# Patient Record
Sex: Male | Born: 1955 | Race: White | Hispanic: No | Marital: Single | Smoking: Former smoker
Health system: Southern US, Community
[De-identification: ages and names within clinical notes are randomized; demographics above are authoritative.]

## PROBLEM LIST (undated history)

## (undated) DIAGNOSIS — I519 Heart disease, unspecified: Secondary | ICD-10-CM

## (undated) DIAGNOSIS — I219 Acute myocardial infarction, unspecified: Secondary | ICD-10-CM

## (undated) HISTORY — DX: Heart disease, unspecified: I51.9

## (undated) HISTORY — DX: Acute myocardial infarction, unspecified: I21.9

---

## 2010-02-04 ENCOUNTER — Ambulatory Visit: Payer: Self-pay | Admitting: Emergency Medicine

## 2010-02-04 LAB — CONVERTED CEMR LAB
Bilirubin Urine: NEGATIVE
Glucose, Urine, Semiquant: NEGATIVE
Ketones, urine, test strip: NEGATIVE
Nitrite: NEGATIVE
WBC Urine, dipstick: NEGATIVE
pH: 5.5

## 2010-07-15 NOTE — Assessment & Plan Note (Signed)
Summary: Sean Benson PROBLEMS/TJ   Vital Signs:  Patient Profile:   55 Years Old Male CC:      fever, HA, dark urine, vomiting x yesterday Height:     68 inches Weight:      162 pounds O2 Sat:      99 % O2 treatment:    Room Air Temp:     98.7 degrees F oral Pulse rate:   92 / minute Resp:     16 per minute BP sitting:   112 / 72  (left arm) Cuff size:   regular  Pt. in pain?   yes    Location:   head    Intensity:   7    Type:       aching  Vitals Entered By: Lajean Saver RN (February 04, 2010 3:43 PM)                   Updated Prior Medication List: COREG 12.5 MG TABS (CARVEDILOL)  FOLIC ACID 1 MG TABS (FOLIC ACID)  LASIX 80 MG TABS (FUROSEMIDE) BID  Current Allergies: ! PCN ! SULFAHistory of Present Illness History from: patient Chief Complaint: fever, HA, dark urine, vomiting x yesterday History of Present Illness: Patient w/ multiple medical problems including a recent pacemaker implantation for CHF, EtOH abuse, quad bypass.  Presents today with sinus congestion, HA, F/C, coughing.  Baseline SOB.  Also with Nausea yesterday and "dark urine" that has improved today after drinking a few beers.  He states that he was at jury duty yesterday and felt that his anxiety led to these symptoms, and now that he was dismissed due to his heart condition, he is feeling much better.  He is asking for Clindamycin which helps his sinus infection. He thinks his sinus problems are from working in the yard last week.  REVIEW OF SYSTEMS Constitutional Symptoms       Complains of fever and fatigue.     Denies chills, night sweats, weight loss, and weight gain.      Comments: yesterday Eyes       Denies change in vision, eye pain, eye discharge, glasses, contact lenses, and eye surgery. Ear/Nose/Throat/Mouth       Complains of sinus problems.      Denies hearing loss/aids, change in hearing, ear pain, ear discharge, dizziness, frequent runny nose, frequent nose bleeds, sore  throat, hoarseness, and tooth pain or bleeding.  Respiratory       Denies dry cough, productive cough, wheezing, shortness of breath, asthma, bronchitis, and emphysema/COPD.  Cardiovascular       Denies murmurs, chest pain, and tires easily with exhertion.    Gastrointestinal       Complains of nausea/vomiting.      Denies stomach pain, diarrhea, constipation, blood in bowel movements, and indigestion. Genitourniary       Denies painful urination, kidney stones, and loss of urinary control.      Comments: dark urinex this AM Neurological       Denies paralysis, seizures, and fainting/blackouts. Musculoskeletal       Denies muscle pain, joint pain, joint stiffness, decreased range of motion, redness, swelling, muscle weakness, and gout.  Skin       Denies bruising, unusual mles/lumps or sores, and hair/skin or nail changes.  Psych       Denies mood changes, temper/anger issues, anxiety/stress, speech problems, depression, and sleep problems.  Past History:  Past Medical History: Congestive heart failure Quad bypass Defibrilator 01-2010  Past Surgical History: Bypass 2003 Permanent pacemaker 01-2010  Family History: Mother- cardiac stents Brother Bypass Family History Diabetes 1st degree relative- father  Social History: Current Smoker 1-2 ppd Alcohol use-yes 4-6/day Drug use-no Smoking Status:  current Drug Use:  no Physical Exam General appearance: well developed, well nourished, no acute distress Ears: normal, no lesions or deformities.  +cerumen Nasal: mucosa pink, nonedematous, no septal deviation, turbinates normal Oral/Pharynx: pharyngeal erythema without exudate, uvula midline without deviation Neck: ant cerv tender LAD Chest/Lungs: no rales, wheezes, or rhonchi bilateral, breath sounds equal without effort.  ICD pacemaker is palpable under left chest skin and scar in well-healed Heart: regular rate and  rhythm, no murmur Abdomen: soft, non-tender without obvious  organomegaly Skin: no obvious rashes or lesions MSE: oriented to time, place, and person Assessment New Problems: UPPER RESPIRATORY INFECTION, ACUTE (ICD-465.9) FAMILY HISTORY DIABETES 1ST DEGREE RELATIVE (ICD-V18.0) CONGESTIVE HEART FAILURE (ICD-428.0)  Likely viral URI + possible viral gastroenteritis yesterday (resolved),.  But with his history of recent heart surgery, he should probably be treated just in case it is bacterial sinusitis  Plan New Medications/Changes: CLINDAMYCIN HCL 300 MG CAPS (CLINDAMYCIN HCL) 1 tab by mouth two times a day for 7 days  #14 x 0, 02/04/2010, Hoyt Koch MD  New Orders: New Patient Level III [id number] Planning Comments:   Establish a PCP, especially with all your medical problems because your cardiologist likely can't do everything for you Cut down on drinking and monitor your fluid intake because of your CHF Take the meds as your doctor has instructed Stop smoking as this causes more heart problems Rest Wear a mask when working in your yard   The patient and/or caregiver has been counseled thoroughly with regard to medications prescribed including dosage, schedule, interactions, rationale for use, and possible side effects and they verbalize understanding.  Diagnoses and expected course of recovery discussed and will return if not improved as expected or if the condition worsens. Patient and/or caregiver verbalized understanding.  Prescriptions: CLINDAMYCIN HCL 300 MG CAPS (CLINDAMYCIN HCL) 1 tab by mouth two times a day for 7 days  #14 x 0   Entered and Authorized by:   Hoyt Koch MD   Signed by:   Hoyt Koch MD on 02/04/2010   Method used:   Print then Give to Patient   RxID:   [id number]   Orders Added: 1)  New Patient Level III [60737]  Laboratory Results   Urine Tests  Date/Time Received: February 04, 2010 4:16 PM  Date/Time Reported: February 04, 2010 4:16 PM   Routine Urinalysis   Color: yellow Appearance:  Clear Glucose: negative   (Normal Range: Negative) Bilirubin: negative   (Normal Range: Negative) Ketone: negative   (Normal Range: Negative) Spec. Gravity: <1.005   (Normal Range: 1.003-1.035) Blood: trace-lysed   (Normal Range: Negative) pH: 5.5   (Normal Range: 5.0-8.0) Protein: negative   (Normal Range: Negative) Urobilinogen: 0.2   (Normal Range: 0-1) Nitrite: negative   (Normal Range: Negative) Leukocyte Esterace: negative   (Normal Range: Negative)

## 2011-08-20 DIAGNOSIS — I428 Other cardiomyopathies: Secondary | ICD-10-CM | POA: Diagnosis not present

## 2011-08-20 DIAGNOSIS — I509 Heart failure, unspecified: Secondary | ICD-10-CM | POA: Diagnosis not present

## 2011-08-20 DIAGNOSIS — I2589 Other forms of chronic ischemic heart disease: Secondary | ICD-10-CM | POA: Diagnosis not present

## 2011-08-20 DIAGNOSIS — I10 Essential (primary) hypertension: Secondary | ICD-10-CM | POA: Diagnosis not present

## 2011-09-17 DIAGNOSIS — I509 Heart failure, unspecified: Secondary | ICD-10-CM | POA: Diagnosis not present

## 2011-09-18 DIAGNOSIS — I259 Chronic ischemic heart disease, unspecified: Secondary | ICD-10-CM | POA: Insufficient documentation

## 2011-09-18 DIAGNOSIS — Z951 Presence of aortocoronary bypass graft: Secondary | ICD-10-CM | POA: Insufficient documentation

## 2011-10-01 DIAGNOSIS — I739 Peripheral vascular disease, unspecified: Secondary | ICD-10-CM | POA: Diagnosis not present

## 2011-12-31 DIAGNOSIS — I2589 Other forms of chronic ischemic heart disease: Secondary | ICD-10-CM | POA: Diagnosis not present

## 2012-01-04 DIAGNOSIS — J811 Chronic pulmonary edema: Secondary | ICD-10-CM | POA: Diagnosis not present

## 2012-01-04 DIAGNOSIS — I2589 Other forms of chronic ischemic heart disease: Secondary | ICD-10-CM | POA: Diagnosis not present

## 2012-01-04 DIAGNOSIS — I509 Heart failure, unspecified: Secondary | ICD-10-CM | POA: Diagnosis not present

## 2012-01-04 DIAGNOSIS — I739 Peripheral vascular disease, unspecified: Secondary | ICD-10-CM | POA: Diagnosis not present

## 2012-01-04 DIAGNOSIS — I4949 Other premature depolarization: Secondary | ICD-10-CM | POA: Diagnosis not present

## 2012-01-05 DIAGNOSIS — I5023 Acute on chronic systolic (congestive) heart failure: Secondary | ICD-10-CM | POA: Diagnosis not present

## 2012-01-06 DIAGNOSIS — I5023 Acute on chronic systolic (congestive) heart failure: Secondary | ICD-10-CM | POA: Diagnosis not present

## 2012-01-22 DIAGNOSIS — I509 Heart failure, unspecified: Secondary | ICD-10-CM | POA: Diagnosis not present

## 2012-01-22 DIAGNOSIS — I739 Peripheral vascular disease, unspecified: Secondary | ICD-10-CM | POA: Diagnosis not present

## 2012-02-11 DIAGNOSIS — I509 Heart failure, unspecified: Secondary | ICD-10-CM | POA: Diagnosis not present

## 2012-03-06 DIAGNOSIS — Z8679 Personal history of other diseases of the circulatory system: Secondary | ICD-10-CM | POA: Diagnosis not present

## 2012-03-06 DIAGNOSIS — Z7982 Long term (current) use of aspirin: Secondary | ICD-10-CM | POA: Diagnosis not present

## 2012-03-06 DIAGNOSIS — T792XXA Traumatic secondary and recurrent hemorrhage and seroma, initial encounter: Secondary | ICD-10-CM | POA: Diagnosis not present

## 2012-03-06 DIAGNOSIS — R58 Hemorrhage, not elsewhere classified: Secondary | ICD-10-CM | POA: Diagnosis not present

## 2012-05-11 DIAGNOSIS — I2589 Other forms of chronic ischemic heart disease: Secondary | ICD-10-CM | POA: Diagnosis not present

## 2012-07-21 DIAGNOSIS — Z23 Encounter for immunization: Secondary | ICD-10-CM | POA: Diagnosis not present

## 2012-08-15 DIAGNOSIS — I2589 Other forms of chronic ischemic heart disease: Secondary | ICD-10-CM | POA: Diagnosis not present

## 2012-10-06 DIAGNOSIS — I2589 Other forms of chronic ischemic heart disease: Secondary | ICD-10-CM | POA: Diagnosis not present

## 2012-10-09 DIAGNOSIS — K709 Alcoholic liver disease, unspecified: Secondary | ICD-10-CM | POA: Insufficient documentation

## 2012-10-09 DIAGNOSIS — I455 Other specified heart block: Secondary | ICD-10-CM | POA: Diagnosis not present

## 2012-10-09 DIAGNOSIS — T82198A Other mechanical complication of other cardiac electronic device, initial encounter: Secondary | ICD-10-CM | POA: Insufficient documentation

## 2012-10-09 DIAGNOSIS — I4891 Unspecified atrial fibrillation: Secondary | ICD-10-CM | POA: Insufficient documentation

## 2012-10-09 DIAGNOSIS — I499 Cardiac arrhythmia, unspecified: Secondary | ICD-10-CM | POA: Diagnosis not present

## 2012-10-09 DIAGNOSIS — Z95810 Presence of automatic (implantable) cardiac defibrillator: Secondary | ICD-10-CM | POA: Diagnosis not present

## 2012-10-10 DIAGNOSIS — I442 Atrioventricular block, complete: Secondary | ICD-10-CM | POA: Diagnosis not present

## 2012-10-10 DIAGNOSIS — I369 Nonrheumatic tricuspid valve disorder, unspecified: Secondary | ICD-10-CM | POA: Diagnosis not present

## 2012-10-11 DIAGNOSIS — I509 Heart failure, unspecified: Secondary | ICD-10-CM | POA: Diagnosis not present

## 2012-10-14 DIAGNOSIS — Z9889 Other specified postprocedural states: Secondary | ICD-10-CM | POA: Diagnosis not present

## 2012-10-14 DIAGNOSIS — I4891 Unspecified atrial fibrillation: Secondary | ICD-10-CM | POA: Diagnosis not present

## 2012-10-26 DIAGNOSIS — I4891 Unspecified atrial fibrillation: Secondary | ICD-10-CM | POA: Diagnosis not present

## 2012-10-26 DIAGNOSIS — T82198A Other mechanical complication of other cardiac electronic device, initial encounter: Secondary | ICD-10-CM | POA: Diagnosis not present

## 2012-10-26 DIAGNOSIS — Z9889 Other specified postprocedural states: Secondary | ICD-10-CM | POA: Diagnosis not present

## 2012-10-26 DIAGNOSIS — I5022 Chronic systolic (congestive) heart failure: Secondary | ICD-10-CM | POA: Diagnosis not present

## 2012-11-02 DIAGNOSIS — Z9889 Other specified postprocedural states: Secondary | ICD-10-CM | POA: Diagnosis not present

## 2012-11-02 DIAGNOSIS — I4891 Unspecified atrial fibrillation: Secondary | ICD-10-CM | POA: Diagnosis not present

## 2012-11-15 DIAGNOSIS — Z9889 Other specified postprocedural states: Secondary | ICD-10-CM | POA: Diagnosis not present

## 2012-11-15 DIAGNOSIS — I4891 Unspecified atrial fibrillation: Secondary | ICD-10-CM | POA: Diagnosis not present

## 2012-11-18 DIAGNOSIS — I4949 Other premature depolarization: Secondary | ICD-10-CM | POA: Diagnosis not present

## 2012-11-18 DIAGNOSIS — M7989 Other specified soft tissue disorders: Secondary | ICD-10-CM | POA: Diagnosis not present

## 2012-11-18 DIAGNOSIS — R0602 Shortness of breath: Secondary | ICD-10-CM | POA: Diagnosis not present

## 2012-11-18 DIAGNOSIS — I5022 Chronic systolic (congestive) heart failure: Secondary | ICD-10-CM | POA: Diagnosis not present

## 2012-11-28 DIAGNOSIS — I10 Essential (primary) hypertension: Secondary | ICD-10-CM | POA: Diagnosis present

## 2012-11-28 DIAGNOSIS — F411 Generalized anxiety disorder: Secondary | ICD-10-CM | POA: Diagnosis present

## 2012-11-28 DIAGNOSIS — I509 Heart failure, unspecified: Secondary | ICD-10-CM | POA: Diagnosis not present

## 2012-11-28 DIAGNOSIS — Z882 Allergy status to sulfonamides status: Secondary | ICD-10-CM | POA: Diagnosis not present

## 2012-11-28 DIAGNOSIS — Z87891 Personal history of nicotine dependence: Secondary | ICD-10-CM | POA: Diagnosis not present

## 2012-11-28 DIAGNOSIS — K859 Acute pancreatitis without necrosis or infection, unspecified: Secondary | ICD-10-CM | POA: Diagnosis not present

## 2012-11-28 DIAGNOSIS — K8020 Calculus of gallbladder without cholecystitis without obstruction: Secondary | ICD-10-CM | POA: Diagnosis not present

## 2012-11-28 DIAGNOSIS — R0602 Shortness of breath: Secondary | ICD-10-CM | POA: Diagnosis not present

## 2012-11-28 DIAGNOSIS — E785 Hyperlipidemia, unspecified: Secondary | ICD-10-CM | POA: Diagnosis present

## 2012-11-28 DIAGNOSIS — I4891 Unspecified atrial fibrillation: Secondary | ICD-10-CM | POA: Diagnosis present

## 2012-11-28 DIAGNOSIS — I499 Cardiac arrhythmia, unspecified: Secondary | ICD-10-CM | POA: Diagnosis not present

## 2012-11-28 DIAGNOSIS — R188 Other ascites: Secondary | ICD-10-CM | POA: Diagnosis not present

## 2012-11-28 DIAGNOSIS — Z7982 Long term (current) use of aspirin: Secondary | ICD-10-CM | POA: Diagnosis not present

## 2012-11-28 DIAGNOSIS — Z95810 Presence of automatic (implantable) cardiac defibrillator: Secondary | ICD-10-CM | POA: Diagnosis not present

## 2012-11-28 DIAGNOSIS — I5022 Chronic systolic (congestive) heart failure: Secondary | ICD-10-CM | POA: Diagnosis not present

## 2012-11-28 DIAGNOSIS — Z880 Allergy status to penicillin: Secondary | ICD-10-CM | POA: Diagnosis not present

## 2012-11-28 DIAGNOSIS — K8070 Calculus of gallbladder and bile duct without cholecystitis without obstruction: Secondary | ICD-10-CM | POA: Diagnosis not present

## 2012-11-28 DIAGNOSIS — I5023 Acute on chronic systolic (congestive) heart failure: Secondary | ICD-10-CM | POA: Diagnosis not present

## 2012-11-28 DIAGNOSIS — Z7901 Long term (current) use of anticoagulants: Secondary | ICD-10-CM | POA: Diagnosis not present

## 2012-11-28 DIAGNOSIS — Z951 Presence of aortocoronary bypass graft: Secondary | ICD-10-CM | POA: Diagnosis not present

## 2012-11-28 DIAGNOSIS — K219 Gastro-esophageal reflux disease without esophagitis: Secondary | ICD-10-CM | POA: Diagnosis present

## 2012-11-28 DIAGNOSIS — I2510 Atherosclerotic heart disease of native coronary artery without angina pectoris: Secondary | ICD-10-CM | POA: Diagnosis present

## 2012-11-28 DIAGNOSIS — F329 Major depressive disorder, single episode, unspecified: Secondary | ICD-10-CM | POA: Diagnosis present

## 2012-11-28 DIAGNOSIS — R1031 Right lower quadrant pain: Secondary | ICD-10-CM | POA: Diagnosis not present

## 2012-11-29 DIAGNOSIS — K859 Acute pancreatitis without necrosis or infection, unspecified: Secondary | ICD-10-CM | POA: Insufficient documentation

## 2012-12-01 DIAGNOSIS — I5022 Chronic systolic (congestive) heart failure: Secondary | ICD-10-CM | POA: Diagnosis not present

## 2012-12-01 DIAGNOSIS — I509 Heart failure, unspecified: Secondary | ICD-10-CM | POA: Diagnosis not present

## 2012-12-01 DIAGNOSIS — I2510 Atherosclerotic heart disease of native coronary artery without angina pectoris: Secondary | ICD-10-CM | POA: Diagnosis not present

## 2012-12-02 DIAGNOSIS — K859 Acute pancreatitis without necrosis or infection, unspecified: Secondary | ICD-10-CM | POA: Diagnosis not present

## 2012-12-08 DIAGNOSIS — I509 Heart failure, unspecified: Secondary | ICD-10-CM | POA: Diagnosis not present

## 2012-12-25 DIAGNOSIS — K8020 Calculus of gallbladder without cholecystitis without obstruction: Secondary | ICD-10-CM | POA: Diagnosis not present

## 2012-12-25 DIAGNOSIS — I4949 Other premature depolarization: Secondary | ICD-10-CM | POA: Diagnosis not present

## 2012-12-25 DIAGNOSIS — R109 Unspecified abdominal pain: Secondary | ICD-10-CM | POA: Diagnosis not present

## 2012-12-25 DIAGNOSIS — R609 Edema, unspecified: Secondary | ICD-10-CM | POA: Diagnosis not present

## 2012-12-25 DIAGNOSIS — R0602 Shortness of breath: Secondary | ICD-10-CM | POA: Diagnosis not present

## 2012-12-25 DIAGNOSIS — Z951 Presence of aortocoronary bypass graft: Secondary | ICD-10-CM | POA: Diagnosis not present

## 2012-12-25 DIAGNOSIS — M7989 Other specified soft tissue disorders: Secondary | ICD-10-CM | POA: Diagnosis not present

## 2012-12-25 DIAGNOSIS — R630 Anorexia: Secondary | ICD-10-CM | POA: Diagnosis not present

## 2012-12-25 DIAGNOSIS — I509 Heart failure, unspecified: Secondary | ICD-10-CM | POA: Diagnosis not present

## 2012-12-25 DIAGNOSIS — Z8679 Personal history of other diseases of the circulatory system: Secondary | ICD-10-CM | POA: Diagnosis not present

## 2012-12-25 DIAGNOSIS — R110 Nausea: Secondary | ICD-10-CM | POA: Diagnosis not present

## 2012-12-25 DIAGNOSIS — I10 Essential (primary) hypertension: Secondary | ICD-10-CM | POA: Diagnosis not present

## 2012-12-26 DIAGNOSIS — I509 Heart failure, unspecified: Secondary | ICD-10-CM | POA: Diagnosis not present

## 2012-12-26 DIAGNOSIS — R0602 Shortness of breath: Secondary | ICD-10-CM | POA: Diagnosis not present

## 2012-12-28 DIAGNOSIS — E871 Hypo-osmolality and hyponatremia: Secondary | ICD-10-CM | POA: Insufficient documentation

## 2013-01-02 ENCOUNTER — Ambulatory Visit (INDEPENDENT_AMBULATORY_CARE_PROVIDER_SITE_OTHER): Payer: Medicare Other | Admitting: Family Medicine

## 2013-01-02 ENCOUNTER — Encounter: Payer: Self-pay | Admitting: Family Medicine

## 2013-01-02 VITALS — BP 115/66 | HR 70 | Ht 67.5 in | Wt 209.0 lb

## 2013-01-02 DIAGNOSIS — I5022 Chronic systolic (congestive) heart failure: Secondary | ICD-10-CM

## 2013-01-02 DIAGNOSIS — I4891 Unspecified atrial fibrillation: Secondary | ICD-10-CM | POA: Diagnosis not present

## 2013-01-02 DIAGNOSIS — I83893 Varicose veins of bilateral lower extremities with other complications: Secondary | ICD-10-CM

## 2013-01-02 DIAGNOSIS — I509 Heart failure, unspecified: Secondary | ICD-10-CM

## 2013-01-02 DIAGNOSIS — R609 Edema, unspecified: Secondary | ICD-10-CM

## 2013-01-02 DIAGNOSIS — R601 Generalized edema: Secondary | ICD-10-CM

## 2013-01-02 DIAGNOSIS — I83892 Varicose veins of left lower extremities with other complications: Secondary | ICD-10-CM

## 2013-01-02 DIAGNOSIS — I83899 Varicose veins of unspecified lower extremities with other complications: Secondary | ICD-10-CM | POA: Insufficient documentation

## 2013-01-02 DIAGNOSIS — Z95810 Presence of automatic (implantable) cardiac defibrillator: Secondary | ICD-10-CM

## 2013-01-02 MED ORDER — FUROSEMIDE 80 MG PO TABS: ORAL_TABLET | ORAL | Status: DC

## 2013-01-02 MED ORDER — TRAMADOL HCL 50 MG PO TABS: 50.0000 mg | ORAL_TABLET | Freq: Four times a day (QID) | ORAL | Status: DC | PRN

## 2013-01-02 NOTE — Progress Notes (Signed)
CC: Sean Benson is a 57 y.o. male is here for Establish Care, Gout? and check vein on foot   Subjective: HPI:  Patient presents to establish care  Patient complains of right and left leg pain is localized to the thighs and knees. Has been present for 2 months is described as a tightness and soreness. It is worse with touching the knees or with flexion of the knees. There is no pain at rest otherwise it is moderate severity. He was prescribed hydrocodone from another provider last week which helps somewhat nothing else makes better or worse. It has been accompanied with worsening swelling thought to be due to congestive heart failure. He has been taking Lasix 80 mg twice a day up until last week when a nurse practitioner that visits him for heart failure monitoring switched him to demadex 80mg  daily at that time he noticed a significant worsening of his swelling involving the entire legs and abdomen.  Over the weekend he switched back to Lasix 80 mg twice a day his former dosage feels he has made some improvement since the switch but still having pain and swelling in both thighs.  When questioned about diet, salt intake, and medication regimen he persistently changes the subject or does not answer the question. On chart review he has a history of heavy alcohol use and has been unable to follow salt restriction and fluid restriction recommendations from former providers at [facility].  Patient complains of left foot pain and bleeding. The pain is described as mild irritation on the surface of the foot. One week ago he noticed spontaneous bleeding from the top of the left foot, if he does not keep this wound covered it frequently begins bleeding again. He tells me prior to the bleeding he noticed a vein that was bulging out of the skin. He's keeping the wound covered no other interventions. He denies bleeding elsewhere from the body, bruising nor blood in stool, he does not answer whether or not he is  still taking Coumadin or aspirin.  Review of Systems - General ROS: negative for - chills, fever, night sweats, weight gain or weight loss Ophthalmic ROS: negative for - decreased vision Psychological ROS: negative for - anxiety or depression ENT ROS: negative for - hearing change, nasal congestion, tinnitus or allergies Hematological and Lymphatic ROS: negative for - bleeding problems, bruising or swollen lymph nodes Breast ROS: negative Respiratory ROS: noshortness of breath, or wheezing Cardiovascular ROS: no chest pain or dyspnea on exertion Gastrointestinal ROS: no abdominal pain, change in bowel habits, or black or bloody stools Genito-Urinary ROS: negative for - genital discharge, genital ulcers, incontinence or abnormal bleeding from genitals Musculoskeletal ROS: negative for - joint pain or muscle pain other than that above Neurological ROS: negative for - headaches or memory loss Dermatological ROS: negative for lumps, mole changes, rash and skin lesion changes  Past Medical History  Diagnosis Date  . Heart disease   . Heart attack      Family History  Problem Relation Age of Onset  . Alcoholism    . Diabetes Father      History  Substance Use Topics  . Smoking status: Former Games developer  . Smokeless tobacco: Not on file  . Alcohol Use: Yes     Objective: Filed Vitals:   01/02/13 1505  BP: 115/66  Pulse: 70    General: Alert and Oriented, No Acute Distress HEENT: Pupils equal, round, reactive to light. Conjunctivae clear.  Moist mucous membranes  pharynx unremarkable Lungs: Clear to auscultation bilaterally, no wheezing/ronchi/rales.  Comfortable work of breathing. Good air movement. Cardiac: Regular rate and rhythm. Normal S1/S2.  No murmurs, rubs, nor gallops.   Abdomen: Obese soft nontender Extremities: 1+ pitting edema from the shins proximally into the thighs..  Trace dorsalis pedis pulse bilaterally. Both right and left knees have full range of motion and  strength however flexion beyond 15 reproduces presenting pain, anterior drawer negative, no pain or laxity without gross or varus stress, patient unable to tolerate mcMurray's due to pain Mental Status: No depression, anxiety, nor agitation. Skin: Warm and dry. On the left lateral dorsal surface there is a well-healing scabbed over shallow ulceration without active bleeding or evidence of infection approximately 3 mm in diameter  Assessment & Plan: Sean Benson was seen today for establish care, gout? and check vein on foot.  Diagnoses and associated orders for this visit:  CHF (congestive heart failure), chronic, systolic - furosemide (LASIX) 80 MG tablet; One by mouth two to three times a day as needed for swelling.  Edema - traMADol (ULTRAM) 50 MG tablet; Take 1 tablet (50 mg total) by mouth every 6 (six) hours as needed for pain.  Bleeding from varicose vein, left  Anasarca  Atrial fibrillation  Automatic implantable cardiac defibrillator in situ  Other Orders - aspirin EC 81 MG tablet; Take 1 tablet (81 mg total) by mouth daily. - carvedilol (COREG) 12.5 MG tablet; Take 1 tablet (12.5 mg total) by mouth 2 (two) times daily. - colchicine 0.6 MG tablet; Take 1 tablet (0.6 mg total) by mouth daily. - lisinopril (PRINIVIL,ZESTRIL) 5 MG tablet; Take 1 tablet (5 mg total) by mouth daily. - lovastatin (MEVACOR) 20 MG tablet; Take 1 tablet (20 mg total) by mouth daily. - omeprazole (PRILOSEC) 20 MG capsule; Take 1 capsule (20 mg total) by mouth daily. - sertraline (ZOLOFT) 25 MG tablet; Take 1 tablet (25 mg total) by mouth daily. - spironolactone (ALDACTONE) 25 MG tablet; Take 1 tablet (25 mg total) by mouth daily. - warfarin (COUMADIN) 5 MG tablet; Take 1 tablet (5 mg total) by mouth daily.    Peripheral edema: Discussed with patient I believe his pain is due to substantial edema in the pain he is experiencing is coming from stretching the skin with knee movement, I would expect this to  improve drastically if diuretic regimen was optimized. Will restart his former Lasix regimen, I encouraged him to use 3 times a day dosing for the next 2 days then go back to only twice a day. He is requesting hydrocodone for pain but is quite agreeable with using tramadol instead. Patient refuses compression stockings, encouraged him to focus on less than 1.5 L of fluid a day and less than 1500 mg of salt daily. Bleeding varicose vein: Provided patient with reassurance that compression and cleanliness should allow this to heal in the next one to 2 weeks. Covered with sterile gauze and compression with coban given supplies to do this at home daily for the next week. Counseled that ambiguity about medication regimen could complicate receiving optimal medical care.  Return in about 2 weeks (around 01/16/2013).

## 2013-01-06 ENCOUNTER — Telehealth: Payer: Self-pay

## 2013-01-06 DIAGNOSIS — I5022 Chronic systolic (congestive) heart failure: Secondary | ICD-10-CM

## 2013-01-06 MED ORDER — METOLAZONE 2.5 MG PO TABS: ORAL_TABLET | ORAL | Status: DC

## 2013-01-06 NOTE — Telephone Encounter (Signed)
Patient advised.

## 2013-01-06 NOTE — Telephone Encounter (Signed)
Sean Benson,  Will you please let Jevaughn know that he might get some benefit from adding a medication called metolazone to his medication reigmen, it's taken once a day, I've sent this to his [facility].

## 2013-01-06 NOTE — Telephone Encounter (Signed)
He is swelling in legs and shortness of breath for 9 weeks. No improvement since the increase of his lasix. He is taking lasix 80 mg TID.  No chest pain.

## 2013-02-09 ENCOUNTER — Ambulatory Visit (INDEPENDENT_AMBULATORY_CARE_PROVIDER_SITE_OTHER): Payer: Medicare Other | Admitting: Family Medicine

## 2013-02-09 ENCOUNTER — Encounter: Payer: Self-pay | Admitting: Family Medicine

## 2013-02-09 VITALS — BP 123/67 | HR 77 | Wt 192.0 lb

## 2013-02-09 DIAGNOSIS — L299 Pruritus, unspecified: Secondary | ICD-10-CM | POA: Diagnosis not present

## 2013-02-09 DIAGNOSIS — I509 Heart failure, unspecified: Secondary | ICD-10-CM | POA: Diagnosis not present

## 2013-02-09 MED ORDER — METHYLPREDNISOLONE ACETATE 80 MG/ML IJ SUSP
80.0000 mg | Freq: Once | INTRAMUSCULAR | Status: AC
  Administered 2013-02-09: 80 mg via INTRAMUSCULAR

## 2013-02-09 NOTE — Progress Notes (Signed)
CC: Sean Benson is a 57 y.o. male is here for itching all over and Leg Pain   Subjective: HPI:  Patient complains of itching and has been present for the last 2-3 weeks is present on a daily basis described as severe in severity it is localized from the neck to the abdomen 360 around it also includes the lower legs anteriorly and posteriorly.  He denies change of personal care products, it is improved with hydroxyzine however this puts him to sleep for 12-24 hours. He has tried over-the-counter medications antihistamines which also will put him to sleep for over 12 hours. He denies rashes or skin changes. Nothing else seems to make itching better or worse it is interfering with his sleep. Denies shortness of breath, wheezing, abdominal pain, nor throat closure. States a history of this that comes and goes every year or 2.  Followup CHF: Over the past month he's lost 18 pounds, he was taking Zaroxolyn every morning before Lasix but stopped this after week due to significant increase in edema. Currently he is only taking Lasix 80 mg 2-3 times a day. He continues to drink beer every day and will not quantify how much is drinking. Denies shortness of breath, peripheral edema, PND, orthopnea, abdominal bloating.    Review Of Systems Outlined In HPI  Past Medical History  Diagnosis Date  . Heart disease   . Heart attack      Family History  Problem Relation Age of Onset  . Alcoholism    . Diabetes Father      History  Substance Use Topics  . Smoking status: Former Games developer  . Smokeless tobacco: Not on file  . Alcohol Use: Yes     Objective: Filed Vitals:   02/09/13 1037  BP: 123/67  Pulse: 77    General: Alert and Oriented, No Acute Distress HEENT: Pupils equal, round, reactive to light. Conjunctivae clear.  Moist mucous membranes pharynx unremarkable Lungs: Clear to auscultation bilaterally, no wheezing/ronchi/rales.  Comfortable work of breathing. Good air  movement. Cardiac: Regular rate and rhythm. Normal S1/S2.  No murmurs, rubs, nor gallops.   Abdomen: Mildly distended nontender to palpation Extremities: No peripheral edema.  Strong peripheral pulses.  Mental Status: No depression, anxiety, nor agitation. Skin: Warm and dry. On the torso upper extremity lower extremity there are no skin abnormalities nor erythema  Assessment & Plan: Sean Benson was seen today for itching all over and leg pain.  Diagnoses and associated orders for this visit:  CHF (congestive heart failure)  Itching    Itching: Encouraged patient to have metabolic panel concerned that some of his be due to bile acids due to his cirrhosis and this would help somewhat with whether or not bile acids sequester with be of benefit, he declines. We discussed oral options to control the itch along with intramuscular steroids he would prefer to try Depo-Medrol today will return next week if not improving CHF: Improved continue beta blocker, aspirin, Lasix, lisinopril, spironolactone. Strongly encouraged to stop drinking  25 minutes spent face-to-face during visit today of which at least 50% was counseling or coordinating care regarding CHF, itching.   Return in about 4 weeks (around 03/09/2013).

## 2013-02-09 NOTE — Addendum Note (Signed)
Addended by: Wyline Beady on: 02/09/2013 01:15 PM   Modules accepted: Orders

## 2013-02-10 DIAGNOSIS — I2589 Other forms of chronic ischemic heart disease: Secondary | ICD-10-CM | POA: Diagnosis not present

## 2013-02-17 ENCOUNTER — Telehealth: Payer: Self-pay | Admitting: *Deleted

## 2013-02-17 DIAGNOSIS — L299 Pruritus, unspecified: Secondary | ICD-10-CM

## 2013-02-17 MED ORDER — HYDROXYZINE HCL 25 MG PO TABS: ORAL_TABLET | ORAL | Status: DC

## 2013-02-17 NOTE — Telephone Encounter (Signed)
Rx sent to [facility] in [place] 

## 2013-02-17 NOTE — Telephone Encounter (Signed)
Pt.notified

## 2013-02-17 NOTE — Telephone Encounter (Signed)
Pt states he is still itching. Informed pt your notes say f/u in a week if no better. Pt is asking if you can send something to the pharmacy for the itching. Please advise.  Meyer Cory, LPN

## 2013-02-17 NOTE — Telephone Encounter (Signed)
Pt called and left a message with his name and that's it.Sean Benson called back and left a message stating to call back and leave a brief detailed message as what his concerns are so that his needs can be addressed

## 2013-03-01 ENCOUNTER — Telehealth: Payer: Self-pay | Admitting: *Deleted

## 2013-03-01 DIAGNOSIS — L299 Pruritus, unspecified: Secondary | ICD-10-CM | POA: Insufficient documentation

## 2013-03-01 MED ORDER — CHOLESTYRAMINE 4 G PO PACK: 1.0000 | PACK | Freq: Two times a day (BID) | ORAL | Status: DC

## 2013-03-01 NOTE — Telephone Encounter (Signed)
Pt states he is still itching and he can't take Benadryl b/c he had problems with it years ago and states the Vistaril cause him to have severe stomach pain. He is using Hydrocortisone cream on it already.  He is asking what else can he do or what else you can give him.

## 2013-03-01 NOTE — Telephone Encounter (Signed)
I'd like him to try cholestyramine powder taken in water twice a day sent to his [facility], he could even add certrizine 10mg  daily.

## 2013-03-01 NOTE — Telephone Encounter (Signed)
Pt informed.  Misty Ahmad, LPN  

## 2013-04-24 ENCOUNTER — Other Ambulatory Visit: Payer: Self-pay | Admitting: *Deleted

## 2013-04-24 ENCOUNTER — Other Ambulatory Visit: Payer: Self-pay

## 2013-04-24 ENCOUNTER — Telehealth: Payer: Self-pay | Admitting: *Deleted

## 2013-04-24 DIAGNOSIS — R609 Edema, unspecified: Secondary | ICD-10-CM

## 2013-04-24 DIAGNOSIS — I5022 Chronic systolic (congestive) heart failure: Secondary | ICD-10-CM

## 2013-04-24 MED ORDER — TRAMADOL HCL 50 MG PO TABS: 50.0000 mg | ORAL_TABLET | Freq: Four times a day (QID) | ORAL | Status: DC | PRN

## 2013-04-24 MED ORDER — METOLAZONE 2.5 MG PO TABS: ORAL_TABLET | ORAL | Status: DC

## 2013-04-24 NOTE — Telephone Encounter (Signed)
Pt informed he can take Zyrtec 1 tab Q12hrs per Dr. Ivan Anchors.  Meyer Cory, LPN

## 2013-06-03 ENCOUNTER — Other Ambulatory Visit: Payer: Self-pay | Admitting: Family Medicine

## 2013-06-11 DIAGNOSIS — Z4682 Encounter for fitting and adjustment of non-vascular catheter: Secondary | ICD-10-CM | POA: Diagnosis not present

## 2013-06-11 DIAGNOSIS — I4949 Other premature depolarization: Secondary | ICD-10-CM | POA: Diagnosis not present

## 2013-06-11 DIAGNOSIS — I469 Cardiac arrest, cause unspecified: Secondary | ICD-10-CM | POA: Diagnosis not present

## 2013-06-11 DIAGNOSIS — R4182 Altered mental status, unspecified: Secondary | ICD-10-CM | POA: Diagnosis not present

## 2013-06-11 DIAGNOSIS — J9600 Acute respiratory failure, unspecified whether with hypoxia or hypercapnia: Secondary | ICD-10-CM | POA: Diagnosis not present

## 2013-06-11 DIAGNOSIS — J811 Chronic pulmonary edema: Secondary | ICD-10-CM | POA: Diagnosis not present

## 2013-06-11 DIAGNOSIS — I959 Hypotension, unspecified: Secondary | ICD-10-CM | POA: Diagnosis not present

## 2013-06-11 DIAGNOSIS — N179 Acute kidney failure, unspecified: Secondary | ICD-10-CM | POA: Diagnosis not present

## 2013-06-11 DIAGNOSIS — R6889 Other general symptoms and signs: Secondary | ICD-10-CM | POA: Diagnosis not present

## 2013-06-11 DIAGNOSIS — R0902 Hypoxemia: Secondary | ICD-10-CM | POA: Diagnosis not present

## 2013-06-12 DIAGNOSIS — J9600 Acute respiratory failure, unspecified whether with hypoxia or hypercapnia: Secondary | ICD-10-CM | POA: Diagnosis not present

## 2013-06-12 DIAGNOSIS — I83893 Varicose veins of bilateral lower extremities with other complications: Secondary | ICD-10-CM | POA: Diagnosis not present

## 2013-06-12 DIAGNOSIS — I469 Cardiac arrest, cause unspecified: Secondary | ICD-10-CM | POA: Diagnosis not present

## 2013-06-12 DIAGNOSIS — R0902 Hypoxemia: Secondary | ICD-10-CM | POA: Diagnosis not present

## 2013-06-12 DIAGNOSIS — N179 Acute kidney failure, unspecified: Secondary | ICD-10-CM | POA: Diagnosis not present

## 2013-06-12 DIAGNOSIS — E875 Hyperkalemia: Secondary | ICD-10-CM | POA: Diagnosis not present

## 2013-06-12 DIAGNOSIS — D62 Acute posthemorrhagic anemia: Secondary | ICD-10-CM | POA: Diagnosis not present

## 2013-06-12 DIAGNOSIS — I5022 Chronic systolic (congestive) heart failure: Secondary | ICD-10-CM | POA: Diagnosis not present

## 2013-06-12 DIAGNOSIS — I959 Hypotension, unspecified: Secondary | ICD-10-CM | POA: Diagnosis not present

## 2013-06-12 DIAGNOSIS — E872 Acidosis: Secondary | ICD-10-CM | POA: Diagnosis not present

## 2013-06-12 DIAGNOSIS — I369 Nonrheumatic tricuspid valve disorder, unspecified: Secondary | ICD-10-CM | POA: Diagnosis not present

## 2013-06-12 DIAGNOSIS — R578 Other shock: Secondary | ICD-10-CM | POA: Diagnosis not present

## 2013-06-13 DIAGNOSIS — E872 Acidosis: Secondary | ICD-10-CM | POA: Diagnosis not present

## 2013-06-13 DIAGNOSIS — I469 Cardiac arrest, cause unspecified: Secondary | ICD-10-CM | POA: Diagnosis not present

## 2013-06-13 DIAGNOSIS — I5022 Chronic systolic (congestive) heart failure: Secondary | ICD-10-CM | POA: Diagnosis not present

## 2013-06-13 DIAGNOSIS — D62 Acute posthemorrhagic anemia: Secondary | ICD-10-CM | POA: Diagnosis not present

## 2013-06-13 DIAGNOSIS — J9600 Acute respiratory failure, unspecified whether with hypoxia or hypercapnia: Secondary | ICD-10-CM | POA: Diagnosis not present

## 2013-06-13 DIAGNOSIS — R0902 Hypoxemia: Secondary | ICD-10-CM | POA: Diagnosis not present

## 2013-06-13 DIAGNOSIS — R578 Other shock: Secondary | ICD-10-CM | POA: Diagnosis not present

## 2013-06-13 DIAGNOSIS — N179 Acute kidney failure, unspecified: Secondary | ICD-10-CM | POA: Diagnosis not present

## 2013-06-13 DIAGNOSIS — I959 Hypotension, unspecified: Secondary | ICD-10-CM | POA: Diagnosis not present

## 2013-06-13 DIAGNOSIS — E875 Hyperkalemia: Secondary | ICD-10-CM | POA: Diagnosis not present

## 2013-06-14 DIAGNOSIS — N179 Acute kidney failure, unspecified: Secondary | ICD-10-CM | POA: Diagnosis not present

## 2013-06-14 DIAGNOSIS — R578 Other shock: Secondary | ICD-10-CM | POA: Diagnosis not present

## 2013-06-14 DIAGNOSIS — E872 Acidosis: Secondary | ICD-10-CM | POA: Diagnosis not present

## 2013-06-14 DIAGNOSIS — J9600 Acute respiratory failure, unspecified whether with hypoxia or hypercapnia: Secondary | ICD-10-CM | POA: Diagnosis not present

## 2013-06-14 DIAGNOSIS — D62 Acute posthemorrhagic anemia: Secondary | ICD-10-CM | POA: Diagnosis not present

## 2013-06-14 DIAGNOSIS — E875 Hyperkalemia: Secondary | ICD-10-CM | POA: Diagnosis not present

## 2013-06-14 DIAGNOSIS — I959 Hypotension, unspecified: Secondary | ICD-10-CM | POA: Diagnosis not present

## 2013-06-14 DIAGNOSIS — I83893 Varicose veins of bilateral lower extremities with other complications: Secondary | ICD-10-CM | POA: Diagnosis not present

## 2013-06-15 DIAGNOSIS — R578 Other shock: Secondary | ICD-10-CM | POA: Diagnosis not present

## 2013-06-15 DIAGNOSIS — J9600 Acute respiratory failure, unspecified whether with hypoxia or hypercapnia: Secondary | ICD-10-CM | POA: Diagnosis not present

## 2013-06-15 DIAGNOSIS — E872 Acidosis, unspecified: Secondary | ICD-10-CM | POA: Diagnosis not present

## 2013-06-15 DIAGNOSIS — I83893 Varicose veins of bilateral lower extremities with other complications: Secondary | ICD-10-CM | POA: Diagnosis not present

## 2013-06-15 DIAGNOSIS — D62 Acute posthemorrhagic anemia: Secondary | ICD-10-CM | POA: Diagnosis not present

## 2013-06-15 DIAGNOSIS — N179 Acute kidney failure, unspecified: Secondary | ICD-10-CM | POA: Diagnosis not present

## 2013-06-15 DIAGNOSIS — N189 Chronic kidney disease, unspecified: Secondary | ICD-10-CM | POA: Diagnosis not present

## 2013-06-15 DIAGNOSIS — E8779 Other fluid overload: Secondary | ICD-10-CM | POA: Diagnosis not present

## 2013-06-16 DIAGNOSIS — E872 Acidosis, unspecified: Secondary | ICD-10-CM | POA: Diagnosis not present

## 2013-06-16 DIAGNOSIS — D62 Acute posthemorrhagic anemia: Secondary | ICD-10-CM | POA: Diagnosis not present

## 2013-06-16 DIAGNOSIS — E875 Hyperkalemia: Secondary | ICD-10-CM | POA: Diagnosis not present

## 2013-06-16 DIAGNOSIS — I959 Hypotension, unspecified: Secondary | ICD-10-CM | POA: Diagnosis not present

## 2013-06-16 DIAGNOSIS — R609 Edema, unspecified: Secondary | ICD-10-CM | POA: Diagnosis not present

## 2013-06-16 DIAGNOSIS — N179 Acute kidney failure, unspecified: Secondary | ICD-10-CM | POA: Diagnosis not present

## 2013-06-16 DIAGNOSIS — I509 Heart failure, unspecified: Secondary | ICD-10-CM | POA: Diagnosis not present

## 2013-06-16 DIAGNOSIS — R0902 Hypoxemia: Secondary | ICD-10-CM | POA: Diagnosis not present

## 2013-06-16 DIAGNOSIS — R578 Other shock: Secondary | ICD-10-CM | POA: Diagnosis not present

## 2013-06-17 DIAGNOSIS — E8779 Other fluid overload: Secondary | ICD-10-CM | POA: Diagnosis not present

## 2013-06-17 DIAGNOSIS — D649 Anemia, unspecified: Secondary | ICD-10-CM | POA: Diagnosis not present

## 2013-06-17 DIAGNOSIS — R578 Other shock: Secondary | ICD-10-CM | POA: Diagnosis not present

## 2013-06-17 DIAGNOSIS — E872 Acidosis, unspecified: Secondary | ICD-10-CM | POA: Diagnosis not present

## 2013-06-17 DIAGNOSIS — N179 Acute kidney failure, unspecified: Secondary | ICD-10-CM | POA: Diagnosis not present

## 2013-06-17 DIAGNOSIS — D62 Acute posthemorrhagic anemia: Secondary | ICD-10-CM | POA: Diagnosis not present

## 2013-06-18 DIAGNOSIS — D62 Acute posthemorrhagic anemia: Secondary | ICD-10-CM | POA: Diagnosis not present

## 2013-06-18 DIAGNOSIS — D649 Anemia, unspecified: Secondary | ICD-10-CM | POA: Diagnosis not present

## 2013-06-18 DIAGNOSIS — E8779 Other fluid overload: Secondary | ICD-10-CM | POA: Diagnosis not present

## 2013-06-18 DIAGNOSIS — E872 Acidosis, unspecified: Secondary | ICD-10-CM | POA: Diagnosis not present

## 2013-06-18 DIAGNOSIS — R578 Other shock: Secondary | ICD-10-CM | POA: Diagnosis not present

## 2013-06-18 DIAGNOSIS — N179 Acute kidney failure, unspecified: Secondary | ICD-10-CM | POA: Diagnosis not present

## 2013-06-19 DIAGNOSIS — R578 Other shock: Secondary | ICD-10-CM | POA: Diagnosis not present

## 2013-06-19 DIAGNOSIS — I959 Hypotension, unspecified: Secondary | ICD-10-CM | POA: Diagnosis not present

## 2013-06-19 DIAGNOSIS — E872 Acidosis, unspecified: Secondary | ICD-10-CM | POA: Diagnosis not present

## 2013-06-19 DIAGNOSIS — I5021 Acute systolic (congestive) heart failure: Secondary | ICD-10-CM | POA: Diagnosis not present

## 2013-06-19 DIAGNOSIS — I509 Heart failure, unspecified: Secondary | ICD-10-CM | POA: Diagnosis not present

## 2013-06-19 DIAGNOSIS — N189 Chronic kidney disease, unspecified: Secondary | ICD-10-CM | POA: Diagnosis not present

## 2013-06-19 DIAGNOSIS — D62 Acute posthemorrhagic anemia: Secondary | ICD-10-CM | POA: Diagnosis not present

## 2013-06-19 DIAGNOSIS — E8779 Other fluid overload: Secondary | ICD-10-CM | POA: Diagnosis not present

## 2013-06-19 DIAGNOSIS — N179 Acute kidney failure, unspecified: Secondary | ICD-10-CM | POA: Diagnosis not present

## 2013-06-19 DIAGNOSIS — R079 Chest pain, unspecified: Secondary | ICD-10-CM | POA: Diagnosis not present

## 2013-06-20 DIAGNOSIS — E872 Acidosis, unspecified: Secondary | ICD-10-CM | POA: Diagnosis not present

## 2013-06-20 DIAGNOSIS — I509 Heart failure, unspecified: Secondary | ICD-10-CM | POA: Diagnosis not present

## 2013-06-20 DIAGNOSIS — R0902 Hypoxemia: Secondary | ICD-10-CM | POA: Diagnosis not present

## 2013-06-20 DIAGNOSIS — R578 Other shock: Secondary | ICD-10-CM | POA: Diagnosis not present

## 2013-06-20 DIAGNOSIS — I5021 Acute systolic (congestive) heart failure: Secondary | ICD-10-CM | POA: Diagnosis not present

## 2013-06-20 DIAGNOSIS — D62 Acute posthemorrhagic anemia: Secondary | ICD-10-CM | POA: Diagnosis not present

## 2013-06-20 DIAGNOSIS — E8779 Other fluid overload: Secondary | ICD-10-CM | POA: Diagnosis not present

## 2013-06-20 DIAGNOSIS — N179 Acute kidney failure, unspecified: Secondary | ICD-10-CM | POA: Diagnosis not present

## 2013-06-20 DIAGNOSIS — J9600 Acute respiratory failure, unspecified whether with hypoxia or hypercapnia: Secondary | ICD-10-CM | POA: Diagnosis not present

## 2013-06-20 DIAGNOSIS — D649 Anemia, unspecified: Secondary | ICD-10-CM | POA: Diagnosis not present

## 2013-06-21 DIAGNOSIS — D62 Acute posthemorrhagic anemia: Secondary | ICD-10-CM | POA: Diagnosis not present

## 2013-06-21 DIAGNOSIS — N179 Acute kidney failure, unspecified: Secondary | ICD-10-CM | POA: Diagnosis not present

## 2013-06-21 DIAGNOSIS — I83893 Varicose veins of bilateral lower extremities with other complications: Secondary | ICD-10-CM | POA: Diagnosis not present

## 2013-06-21 DIAGNOSIS — I2510 Atherosclerotic heart disease of native coronary artery without angina pectoris: Secondary | ICD-10-CM | POA: Diagnosis not present

## 2013-06-21 DIAGNOSIS — F329 Major depressive disorder, single episode, unspecified: Secondary | ICD-10-CM | POA: Diagnosis not present

## 2013-06-21 DIAGNOSIS — E872 Acidosis, unspecified: Secondary | ICD-10-CM | POA: Diagnosis not present

## 2013-06-21 DIAGNOSIS — E7800 Pure hypercholesterolemia, unspecified: Secondary | ICD-10-CM | POA: Diagnosis not present

## 2013-06-21 DIAGNOSIS — I10 Essential (primary) hypertension: Secondary | ICD-10-CM | POA: Diagnosis not present

## 2013-06-21 DIAGNOSIS — R609 Edema, unspecified: Secondary | ICD-10-CM | POA: Diagnosis not present

## 2013-06-21 DIAGNOSIS — I469 Cardiac arrest, cause unspecified: Secondary | ICD-10-CM | POA: Diagnosis not present

## 2013-06-21 DIAGNOSIS — I509 Heart failure, unspecified: Secondary | ICD-10-CM | POA: Diagnosis not present

## 2013-06-21 DIAGNOSIS — M6281 Muscle weakness (generalized): Secondary | ICD-10-CM | POA: Diagnosis not present

## 2013-06-21 DIAGNOSIS — E875 Hyperkalemia: Secondary | ICD-10-CM | POA: Diagnosis not present

## 2013-06-21 DIAGNOSIS — D649 Anemia, unspecified: Secondary | ICD-10-CM | POA: Diagnosis not present

## 2013-06-21 DIAGNOSIS — L0291 Cutaneous abscess, unspecified: Secondary | ICD-10-CM | POA: Diagnosis not present

## 2013-06-21 DIAGNOSIS — R578 Other shock: Secondary | ICD-10-CM | POA: Diagnosis not present

## 2013-06-21 DIAGNOSIS — E785 Hyperlipidemia, unspecified: Secondary | ICD-10-CM | POA: Diagnosis not present

## 2013-06-21 DIAGNOSIS — I519 Heart disease, unspecified: Secondary | ICD-10-CM | POA: Diagnosis not present

## 2013-06-21 DIAGNOSIS — F17200 Nicotine dependence, unspecified, uncomplicated: Secondary | ICD-10-CM | POA: Diagnosis not present

## 2013-06-21 DIAGNOSIS — I259 Chronic ischemic heart disease, unspecified: Secondary | ICD-10-CM | POA: Diagnosis not present

## 2013-06-21 DIAGNOSIS — F3289 Other specified depressive episodes: Secondary | ICD-10-CM | POA: Diagnosis not present

## 2013-06-21 DIAGNOSIS — J9600 Acute respiratory failure, unspecified whether with hypoxia or hypercapnia: Secondary | ICD-10-CM | POA: Diagnosis not present

## 2013-06-23 DIAGNOSIS — D649 Anemia, unspecified: Secondary | ICD-10-CM | POA: Diagnosis not present

## 2013-06-23 DIAGNOSIS — E7800 Pure hypercholesterolemia, unspecified: Secondary | ICD-10-CM | POA: Diagnosis not present

## 2013-06-23 DIAGNOSIS — I2510 Atherosclerotic heart disease of native coronary artery without angina pectoris: Secondary | ICD-10-CM | POA: Diagnosis not present

## 2013-06-23 DIAGNOSIS — I509 Heart failure, unspecified: Secondary | ICD-10-CM | POA: Diagnosis not present

## 2013-06-26 DIAGNOSIS — I509 Heart failure, unspecified: Secondary | ICD-10-CM | POA: Diagnosis not present

## 2013-06-26 DIAGNOSIS — R609 Edema, unspecified: Secondary | ICD-10-CM | POA: Diagnosis not present

## 2013-06-26 DIAGNOSIS — I2510 Atherosclerotic heart disease of native coronary artery without angina pectoris: Secondary | ICD-10-CM | POA: Diagnosis not present

## 2013-07-06 ENCOUNTER — Telehealth: Payer: Self-pay

## 2013-07-06 NOTE — Telephone Encounter (Signed)
Sean Benson left a message on the voice mail stating

## 2013-07-11 ENCOUNTER — Encounter: Payer: Self-pay | Admitting: Family Medicine

## 2013-07-11 ENCOUNTER — Ambulatory Visit (INDEPENDENT_AMBULATORY_CARE_PROVIDER_SITE_OTHER): Payer: Medicare Other | Admitting: Family Medicine

## 2013-07-11 VITALS — BP 101/59 | HR 74 | Wt 191.0 lb

## 2013-07-11 DIAGNOSIS — E876 Hypokalemia: Secondary | ICD-10-CM

## 2013-07-11 DIAGNOSIS — N179 Acute kidney failure, unspecified: Secondary | ICD-10-CM

## 2013-07-11 DIAGNOSIS — R0902 Hypoxemia: Secondary | ICD-10-CM

## 2013-07-11 DIAGNOSIS — Z09 Encounter for follow-up examination after completed treatment for conditions other than malignant neoplasm: Secondary | ICD-10-CM

## 2013-07-11 DIAGNOSIS — M79606 Pain in leg, unspecified: Secondary | ICD-10-CM

## 2013-07-11 DIAGNOSIS — D62 Acute posthemorrhagic anemia: Secondary | ICD-10-CM

## 2013-07-11 DIAGNOSIS — K047 Periapical abscess without sinus: Secondary | ICD-10-CM

## 2013-07-11 DIAGNOSIS — I469 Cardiac arrest, cause unspecified: Secondary | ICD-10-CM | POA: Diagnosis not present

## 2013-07-11 DIAGNOSIS — M79609 Pain in unspecified limb: Secondary | ICD-10-CM

## 2013-07-11 DIAGNOSIS — N189 Chronic kidney disease, unspecified: Secondary | ICD-10-CM

## 2013-07-11 MED ORDER — CEPHALEXIN 500 MG PO CAPS: 500.0000 mg | ORAL_CAPSULE | Freq: Four times a day (QID) | ORAL | Status: AC

## 2013-07-11 MED ORDER — OXYCODONE HCL 5 MG PO TABS: 5.0000 mg | ORAL_TABLET | Freq: Four times a day (QID) | ORAL | Status: DC | PRN

## 2013-07-11 NOTE — Progress Notes (Signed)
CC: Sean Benson is a 58 y.o. male is here for Hospitalization Follow-up   Subjective: HPI:  Patient presents for hospital followup from [facility] December 28-June 7 requiring ICU care along with intubation. Possible visit was prompted by acute blood loss anemia from a bleeding lower extremity varicose vein on the left leg.  Soon after presenting to the emergency room he was found to have a hemoglobin of 3 and went into pulseless electric activity. CPR was initiated and he was successfully resuscitated to be transferred to the medical ICU.  He is unsure exactly when this happened but there was a second episode where he had a cardiac arrest and was brought back after 6 rounds of chest compressions and pressors.  Once admitted to the medical ICU he was found to be in acute on chronic renal failure which required continuous renal replacement. Nephrology was involved and ultimately felt that this was due to ischemic acute tubular necrosis.  He was eventually transferred to [facility] skilled nursing facility where he received physical and occupational therapy he is unsure when he was discharged from that but believes he has been at home for a week now.  Acute blood loss anemia: At discharge his hemoglobin level was 7.5.  It's hard to tell exactly how many units of blood he got but he tells me he believes was 4 units however I anticipate it probably was more.  He denies shortness of breath, chest pain, nor limb claudication since discharge. he is back to eating whatever he wants and has not taken iron supplementation. In one month he has an appointment with [facility] for his bleeding tendency, he is no longer taking Coumadin and was not taking Coumadin at the time of his bleeding episode above.  He denies any bruising or bleeding since discharge.   acute on chronic renal failure: Based on labs from [facility] his baseline creatinine is 1.3-1.5 in his normal state of health. He was  discharged with a creatinine of 1.6 and lisinopril has been withheld pending today's basic metabolic panel. He continues on Lasix 80 mg twice a day for lower extremity edema secondary to congestive heart failure.  Hypokalemia: On multiple metabolic panels while admitted his potassium was 3.4 he reports that he required potassium supplementation once every 3 days orally. He denies cramping, weakness, nor any motor or sensory disturbances since time of discharge. He is not taking any potassium supplementation  He was given oxycodone for chronic leg pain while in the hospital, he's noticed that he's only having to take this medication 1.5-2 times a day it has significantly relieved his pain and does not contain a side effect of itching that tramadol is causing him  His only complaint today is a painful tooth localized to the inferior posterior right molar. Has been present for the last week it is worse with chewing or to the touch. he reports he's had this issue ongoing for years and usually responds well to antibiotics. He denies fevers, chills, swollen lymph nodes nor trouble swallowing.   Review Of Systems Outlined In HPI  Past Medical History  Diagnosis Date  . Heart disease   . Heart attack      Family History  Problem Relation Age of Onset  . Alcoholism    . Diabetes Father      History  Substance Use Topics  . Smoking status: Former Games developer  . Smokeless tobacco: Not on file  . Alcohol Use: Yes  Objective: Filed Vitals:   07/11/13 1100  BP: 101/59  Pulse: 74    General: Alert and Oriented, No Acute Distress HEENT: Pupils equal, round, reactive to light. Conjunctivae clear.   Pink inferior turbinates.  Moist mucous membranes, pharynx without inflammation nor lesions.   poor dentition most notably at the location of his pain inferior posterior right molar.  This tooth is tender to touch but there is no surrounding fluctuance or tenderness to the gums. Neck supple without  palpable lymphadenopathy nor abnormal masses. Lungs: Clear to auscultation bilaterally, no wheezing/ronchi/rales.  Comfortable work of breathing. Good air movement. Cardiac: Regular rate and rhythm. Normal S1/S2.   grade 3/6 holosystolic murmur over second left intercostal space.no rubs or gallops  Abdomen:  obese soft nontender  Extremities: No peripheral edema.  Strong peripheral pulses.  Mental Status: No depression, anxiety, nor agitation. Skin: Warm and dry.  Assessment & Plan: [name] was seen today for hospitalization follow-up.  Diagnoses and associated orders for this visit:  Cardiac arrest  Hospital discharge follow-up  Acute blood loss anemia - CBC  Hypoxemia  Acute on chronic renal failure - BASIC METABOLIC PANEL WITH GFR  Lower extremity pain - oxyCODONE (OXY IR/ROXICODONE) 5 MG immediate release tablet; Take 1 tablet (5 mg total) by mouth every 6 (six) hours as needed for severe pain.  Hypokalemia  Dental abscess - cephALEXin (KEFLEX) 500 MG capsule; Take 1 capsule (500 mg total) by mouth 4 (four) times daily.     hypoxemia: Resolved Acute on chronic renal failure: Check a metabolic panel today to determine if we can restart lisinopril. Encouraged him to followup with nephrology as was the plan at time of discharge Lower extremity pain: Since he cannot take nonsteroidal anti-inflammatories nor acetaminophen containing products will continue low-dose oxycodone he appears to be using this responsibility Hypokalemia: Check a metabolic panel today Acute blood loss anemia: Checking hemoglobin today clinically appears significantly improved Dental abscess: Start Keflex that he has had success with in the past Encouraged to completely stop drinking alcohol and to follow up with cardiology and hematology  40 minutes spent face-to-face during visit today of which at least 50% was counseling or coordinating care regarding: 1. Cardiac arrest   2. Hospital discharge  follow-up   3. Acute blood loss anemia   4. Hypoxemia   5. Acute on chronic renal failure   6. Lower extremity pain   7. Hypokalemia   8. Dental abscess       Return in about 4 weeks (around 08/08/2013).

## 2013-07-12 ENCOUNTER — Telehealth: Payer: Self-pay | Admitting: Family Medicine

## 2013-07-12 DIAGNOSIS — N189 Chronic kidney disease, unspecified: Secondary | ICD-10-CM

## 2013-07-12 DIAGNOSIS — E875 Hyperkalemia: Secondary | ICD-10-CM

## 2013-07-12 DIAGNOSIS — I509 Heart failure, unspecified: Secondary | ICD-10-CM

## 2013-07-12 LAB — CBC
HCT: 24.9 % — ABNORMAL LOW (ref 39.0–52.0)
Hemoglobin: 8 g/dL — ABNORMAL LOW (ref 13.0–17.0)
MCH: 29 pg (ref 26.0–34.0)
MCHC: 32.1 g/dL (ref 30.0–36.0)
MCV: 90.2 fL (ref 78.0–100.0)
PLATELETS: 170 10*3/uL (ref 150–400)
RBC: 2.76 MIL/uL — ABNORMAL LOW (ref 4.22–5.81)
RDW: 17 % — ABNORMAL HIGH (ref 11.5–15.5)
WBC: 4.6 10*3/uL (ref 4.0–10.5)

## 2013-07-12 LAB — BASIC METABOLIC PANEL WITH GFR
BUN: 27 mg/dL — ABNORMAL HIGH (ref 6–23)
CO2: 21 meq/L (ref 19–32)
Calcium: 9.2 mg/dL (ref 8.4–10.5)
Chloride: 102 mEq/L (ref 96–112)
Creat: 1.83 mg/dL — ABNORMAL HIGH (ref 0.50–1.35)
GFR, Est African American: 47 mL/min — ABNORMAL LOW
GFR, Est Non African American: 40 mL/min — ABNORMAL LOW
Glucose, Bld: 89 mg/dL (ref 70–99)
Potassium: 5.4 mEq/L — ABNORMAL HIGH (ref 3.5–5.3)
SODIUM: 134 meq/L — AB (ref 135–145)

## 2013-07-12 NOTE — Telephone Encounter (Signed)
Left message on vm

## 2013-07-12 NOTE — Telephone Encounter (Signed)
Sue Lushndrea, Will you please let Mr. Marcelle OverlieHolland know that his blood cell levels remain stable.  He can help bring this up by taking a daily multivitamin along with a daily Iron Supplement containing 65mg  of Iron.  Kidney function has still not bounced back to the function prior to his hospitalization so do not restart lisinopril.  The nephrology team in the hospital wanted him to f/u with them so I've placed a referral [facility].  Return next week for a lab only visit for BMP (placed in inbox).

## 2013-07-24 ENCOUNTER — Telehealth: Payer: Self-pay | Admitting: *Deleted

## 2013-07-24 NOTE — Telephone Encounter (Signed)
Sean [facility] from [facility] called Friday at 4:23 to let us know that they have not not been able to get in touch with the patient for PT and nursing. They are going to try again today [phone] Sean Benson contact num

## 2013-07-26 ENCOUNTER — Telehealth: Payer: Self-pay | Admitting: *Deleted

## 2013-07-26 DIAGNOSIS — M79606 Pain in leg, unspecified: Secondary | ICD-10-CM

## 2013-07-26 MED ORDER — OXYCODONE HCL 5 MG PO TABS: 5.0000 mg | ORAL_TABLET | Freq: Three times a day (TID) | ORAL | Status: DC

## 2013-07-26 NOTE — Telephone Encounter (Signed)
Confirmed on [facility]  Sue Lushndrea, Rx placed in in-box ready for pickup/faxing.

## 2013-07-26 NOTE — Telephone Encounter (Signed)
Pt called and states he only filled #30 of the last oxycodone rx that was rx'ed because he could not afford the whole amount. Pt request a refill of the oxy since he has now run out of medication. I did call [facility] and they did verify that the pt did only receive #30 tablets. I spoke with Dr. Ivan AnchorsHommel and he is ok with with writing another rx for #60

## 2013-07-26 NOTE — Telephone Encounter (Signed)
rx placed up front

## 2013-07-27 DIAGNOSIS — E8779 Other fluid overload: Secondary | ICD-10-CM | POA: Diagnosis not present

## 2013-07-27 DIAGNOSIS — N183 Chronic kidney disease, stage 3 unspecified: Secondary | ICD-10-CM | POA: Diagnosis not present

## 2013-07-27 DIAGNOSIS — E875 Hyperkalemia: Secondary | ICD-10-CM | POA: Diagnosis not present

## 2013-07-27 DIAGNOSIS — D649 Anemia, unspecified: Secondary | ICD-10-CM | POA: Diagnosis not present

## 2013-07-27 DIAGNOSIS — N179 Acute kidney failure, unspecified: Secondary | ICD-10-CM | POA: Diagnosis not present

## 2013-07-27 DIAGNOSIS — I129 Hypertensive chronic kidney disease with stage 1 through stage 4 chronic kidney disease, or unspecified chronic kidney disease: Secondary | ICD-10-CM | POA: Diagnosis not present

## 2013-07-31 ENCOUNTER — Telehealth: Payer: Self-pay | Admitting: *Deleted

## 2013-07-31 NOTE — Telephone Encounter (Signed)
Pt called and wanted to know if he should continue taking potassium or not. He states [facility] him his potassium was low and now someone just called [facility] and told him he doesn't need to take a potassium pill. Pt wanted to know what he should do. I did advise pt that I could give him any reccommendations based on what he said just because I didn't have the actual lab values that he was referring to. It looks like he had an appt on the 2/12 with [facility]. I did advise him that he should call that office to get clairification since apparently he had lab work there.Pt voiced understanding

## 2013-08-03 ENCOUNTER — Telehealth: Payer: Self-pay | Admitting: *Deleted

## 2013-08-03 DIAGNOSIS — M79606 Pain in leg, unspecified: Secondary | ICD-10-CM

## 2013-08-03 MED ORDER — AMBULATORY NON FORMULARY MEDICATION: Status: DC

## 2013-08-03 NOTE — Telephone Encounter (Signed)
Delaney [facility]igsamara from [facility] called and wanted to get a rx for a shower chair faxed over to the at [phone]. Printed and place in Hommel's box to sign

## 2013-08-11 ENCOUNTER — Other Ambulatory Visit: Payer: Self-pay | Admitting: *Deleted

## 2013-08-11 ENCOUNTER — Encounter: Payer: Self-pay | Admitting: Family Medicine

## 2013-08-11 DIAGNOSIS — M79606 Pain in leg, unspecified: Secondary | ICD-10-CM

## 2013-08-11 DIAGNOSIS — N183 Chronic kidney disease, stage 3 unspecified: Secondary | ICD-10-CM | POA: Insufficient documentation

## 2013-08-11 MED ORDER — AMBULATORY NON FORMULARY MEDICATION: Status: AC

## 2013-08-11 NOTE — Progress Notes (Signed)
Daniel from [facility] says he has been trying to call to get a rx for a shower chair for pt. This was faxed to [phone] on 2/19. Will refax order to this #

## 2013-08-14 DIAGNOSIS — Z95810 Presence of automatic (implantable) cardiac defibrillator: Secondary | ICD-10-CM | POA: Diagnosis not present

## 2013-08-14 DIAGNOSIS — Z4502 Encounter for adjustment and management of automatic implantable cardiac defibrillator: Secondary | ICD-10-CM | POA: Diagnosis not present

## 2013-08-14 DIAGNOSIS — I2589 Other forms of chronic ischemic heart disease: Secondary | ICD-10-CM | POA: Diagnosis not present

## 2013-08-16 DIAGNOSIS — R58 Hemorrhage, not elsewhere classified: Secondary | ICD-10-CM | POA: Diagnosis not present

## 2013-08-16 DIAGNOSIS — I5023 Acute on chronic systolic (congestive) heart failure: Secondary | ICD-10-CM | POA: Diagnosis not present

## 2013-08-16 DIAGNOSIS — I509 Heart failure, unspecified: Secondary | ICD-10-CM | POA: Diagnosis not present

## 2013-08-16 DIAGNOSIS — I739 Peripheral vascular disease, unspecified: Secondary | ICD-10-CM | POA: Diagnosis not present

## 2013-08-17 ENCOUNTER — Telehealth: Payer: Self-pay | Admitting: *Deleted

## 2013-08-17 DIAGNOSIS — M79606 Pain in leg, unspecified: Secondary | ICD-10-CM

## 2013-08-17 MED ORDER — OXYCODONE HCL 5 MG PO TABS: 5.0000 mg | ORAL_TABLET | Freq: Three times a day (TID) | ORAL | Status: DC | PRN

## 2013-08-17 NOTE — Telephone Encounter (Signed)
Pt called asking for refill on OXycodone. He was prescribed 1 q8hrs and 60 disp on 07/26/13. States he went to hematology yesterday and states they told him that it is no doubt that he has leg pain. He also says he has an appt coming up vascular surgery. He states if you won't refill the Oxy can you give him something to get thru until next week when it is due.     Meyer CoryMisty Dessa Ledee, LPN

## 2013-08-17 NOTE — Telephone Encounter (Signed)
Pt notified and rx place up front.

## 2013-08-17 NOTE — Telephone Encounter (Signed)
Andrea, Rx placed in in-box ready for pickup/faxing.  

## 2013-08-30 DIAGNOSIS — I739 Peripheral vascular disease, unspecified: Secondary | ICD-10-CM | POA: Diagnosis not present

## 2013-09-07 ENCOUNTER — Ambulatory Visit (INDEPENDENT_AMBULATORY_CARE_PROVIDER_SITE_OTHER): Payer: Medicare Other | Admitting: Family Medicine

## 2013-09-07 ENCOUNTER — Encounter: Payer: Self-pay | Admitting: Family Medicine

## 2013-09-07 VITALS — BP 109/59 | HR 88 | Wt 193.0 lb

## 2013-09-07 DIAGNOSIS — M79606 Pain in leg, unspecified: Secondary | ICD-10-CM

## 2013-09-07 DIAGNOSIS — M79609 Pain in unspecified limb: Secondary | ICD-10-CM | POA: Diagnosis not present

## 2013-09-07 DIAGNOSIS — K709 Alcoholic liver disease, unspecified: Secondary | ICD-10-CM | POA: Diagnosis not present

## 2013-09-07 DIAGNOSIS — I509 Heart failure, unspecified: Secondary | ICD-10-CM

## 2013-09-07 MED ORDER — OXYCODONE HCL 5 MG PO TABS: 5.0000 mg | ORAL_TABLET | Freq: Three times a day (TID) | ORAL | Status: DC | PRN

## 2013-09-07 NOTE — Progress Notes (Signed)
CC: Sean Benson is a 58 y.o. male is here for Medication Management and Shortness of Breath   Subjective: HPI:  Patient requests clarification on bilateral leg pain interventions. He was told by his hematologist and nephrologist for his current health is stable right now, one of these physician sent him to vascular surgery for what sounds like workup of limb claudication. When he was at this visit he was told that there is not much they could do given his kidney function and that any contrast would likely cause renal failure. Patient seems confused as to why he was even sent to vascular surgery. At this time vascular surgery's notes are not available in Epic. He complains of continued bilateral leg pain that is described only has pain is improved with tramadol in the past but is resolved 100% for 6 hours at a time with oxycodone 5 mg which he is taking approximately 3 times a day. When pain is absent he is able to work in his garage and walk without any pain or weakness.  He denies any locking catching or giving way of his knees. Denies rest pain with elevation of legs. Denies any bleeding, nor bruising of either lower extremity.  Followup alcohol liver disease: He states he's been successful with cutting down on alcohol consumption but does not quantify how much beer he is drinking on a daily basis.  Congestive heart failure: Denies any worsening lower extremity edema or unintentional weight gain. He's tried eating more healthy with avoiding sodium in his diet focusing more on fruits and vegetables. Denies orthopnea nor PND   Review Of Systems Outlined In HPI  Past Medical History  Diagnosis Date  . Heart disease   . Heart attack     No past surgical history on file. Family History  Problem Relation Age of Onset  . Alcoholism    . Diabetes Father     History   Social History  . Marital Status: Single    Spouse Name: N/A    Number of Children: N/A  . Years of Education: N/A    Occupational History  . Not on file.   Social History Main Topics  . Smoking status: Former Games developer  . Smokeless tobacco: Not on file  . Alcohol Use: Yes  . Drug Use: No  . Sexual Activity: Not Currently   Other Topics Concern  . Not on file   Social History Narrative  . No narrative on file     Objective: BP 109/59  Pulse 88  Wt 193 lb (87.544 kg)  SpO2 96%  General: Alert and Oriented, No Acute Distress HEENT: Pupils equal, round, reactive to light. Conjunctivae clear.  Moist membranes pharynx unremarkable Lungs: Clear to auscultation bilaterally, no wheezing/ronchi/rales.  Comfortable work of breathing. Good air movement. Cardiac: Regular rate and rhythm. Normal S1/S2.  No murmurs, rubs, nor gallops.   Abdomen: Normal bowel sounds, soft and non tender without palpable masses. Extremities: Mild bilateral peripheral edema with absent dorsalis pedis pulse bilaterally Mental Status: No depression, anxiety, nor agitation. Skin: Warm and dry. Mild jaundice today  Assessment & Plan: [place] was seen today for medication management and shortness of breath.  Diagnoses and associated orders for this visit:  CHF (congestive heart failure)  Alcoholic liver disease  Lower extremity pain - oxyCODONE (OXY IR/ROXICODONE) 5 MG immediate release tablet; Take 1 tablet (5 mg total) by mouth every 8 (eight) hours as needed (leg pain).    CHF: Controlled he is unsure whether  or not he's taking carvedilol or Lasix, I like for him to bring in his medications at future visits leading to overweight is really taking or not taking Alcoholic liver disease: I have applauded his efforts at decreasing alcohol intake and reminded him that complete cessation will prolong his life expectancy Lower extremity pain: Explain why contrast would be contraindicated given his current renal function and that any intervention with contrast could lead to him requiring dialysis. He understandably wants to  avoid any contrast procedures we will focus on pain control with oxycodone  25 minutes spent face-to-face during visit today of which at least 50% was counseling or coordinating care regarding: 1. CHF (congestive heart failure)   2. Alcoholic liver disease   3. Lower extremity pain      Return in about 3 months (around 12/08/2013).

## 2013-09-19 ENCOUNTER — Telehealth: Payer: Self-pay | Admitting: Family Medicine

## 2013-09-19 NOTE — Telephone Encounter (Signed)
Patient state he needs a call back ASAP TODAY about his medicine and itching.

## 2013-09-19 NOTE — Telephone Encounter (Signed)
Spoke with pt and he states the pain medication has caused  him to itch and he is taking two of zyrtec since yesterday( cant itching be due to liver disease?) I see atarax on his med list but I wanted to confirm that this was ok to send and if you think the itching is likely due to his liver disease and if anything else needed to be done. (also pt was adamant that he called for 45 min without hearing back and I politely reminded him that if he called after 4 then its very possible that a return call may not be issued until the next business day and the next time if he feels he has an urgent matter to please call earlier so that his needs can be addressed in a timely fashion)

## 2013-09-20 ENCOUNTER — Other Ambulatory Visit: Payer: Self-pay | Admitting: *Deleted

## 2013-09-20 MED ORDER — HYDROXYZINE HCL 25 MG PO TABS: ORAL_TABLET | ORAL | Status: AC

## 2013-09-20 NOTE — Telephone Encounter (Signed)
Patient called back and states they Certirizine Hydrochloride is not helping the itching but the pain med is helping his pain. Is there something else that can be prescribed for the itching?? Please call patient back as soon as possible and let him know what can be prescribed for the itching? If there is nothing else that can be tried for the itching, maybe we need to try another pain med?

## 2013-09-20 NOTE — Telephone Encounter (Signed)
Pt notified and rx sent  °

## 2013-09-20 NOTE — Telephone Encounter (Signed)
It's perfectly fine and encouraged for him to take hydroxyzine as needed for itching.  I'd encourage him to also discuss the matter with his GI specialist, if he does not have one currently we can always place a referral.

## 2013-10-04 DIAGNOSIS — J90 Pleural effusion, not elsewhere classified: Secondary | ICD-10-CM | POA: Diagnosis not present

## 2013-10-04 DIAGNOSIS — I369 Nonrheumatic tricuspid valve disorder, unspecified: Secondary | ICD-10-CM | POA: Diagnosis not present

## 2013-10-04 DIAGNOSIS — I5021 Acute systolic (congestive) heart failure: Secondary | ICD-10-CM | POA: Diagnosis not present

## 2013-10-04 DIAGNOSIS — I509 Heart failure, unspecified: Secondary | ICD-10-CM | POA: Diagnosis not present

## 2013-10-04 DIAGNOSIS — N183 Chronic kidney disease, stage 3 unspecified: Secondary | ICD-10-CM | POA: Diagnosis not present

## 2013-10-04 DIAGNOSIS — J811 Chronic pulmonary edema: Secondary | ICD-10-CM | POA: Diagnosis not present

## 2013-10-04 DIAGNOSIS — R0602 Shortness of breath: Secondary | ICD-10-CM | POA: Diagnosis not present

## 2013-10-04 DIAGNOSIS — N179 Acute kidney failure, unspecified: Secondary | ICD-10-CM | POA: Diagnosis not present

## 2013-10-05 DIAGNOSIS — D638 Anemia in other chronic diseases classified elsewhere: Secondary | ICD-10-CM | POA: Diagnosis not present

## 2013-10-05 DIAGNOSIS — I4949 Other premature depolarization: Secondary | ICD-10-CM | POA: Diagnosis not present

## 2013-10-05 DIAGNOSIS — R58 Hemorrhage, not elsewhere classified: Secondary | ICD-10-CM | POA: Diagnosis not present

## 2013-10-05 DIAGNOSIS — N179 Acute kidney failure, unspecified: Secondary | ICD-10-CM | POA: Diagnosis not present

## 2013-10-05 DIAGNOSIS — I509 Heart failure, unspecified: Secondary | ICD-10-CM | POA: Diagnosis not present

## 2013-10-05 DIAGNOSIS — N183 Chronic kidney disease, stage 3 unspecified: Secondary | ICD-10-CM | POA: Diagnosis not present

## 2013-10-05 DIAGNOSIS — D509 Iron deficiency anemia, unspecified: Secondary | ICD-10-CM | POA: Diagnosis not present

## 2013-10-05 DIAGNOSIS — I5021 Acute systolic (congestive) heart failure: Secondary | ICD-10-CM | POA: Diagnosis not present

## 2013-10-06 DIAGNOSIS — I509 Heart failure, unspecified: Secondary | ICD-10-CM | POA: Diagnosis not present

## 2013-10-06 DIAGNOSIS — N183 Chronic kidney disease, stage 3 unspecified: Secondary | ICD-10-CM | POA: Diagnosis not present

## 2013-10-06 DIAGNOSIS — I4891 Unspecified atrial fibrillation: Secondary | ICD-10-CM | POA: Diagnosis not present

## 2013-10-06 DIAGNOSIS — I2589 Other forms of chronic ischemic heart disease: Secondary | ICD-10-CM | POA: Diagnosis not present

## 2013-10-06 DIAGNOSIS — Z79899 Other long term (current) drug therapy: Secondary | ICD-10-CM | POA: Diagnosis not present

## 2013-10-06 DIAGNOSIS — N179 Acute kidney failure, unspecified: Secondary | ICD-10-CM | POA: Diagnosis not present

## 2013-10-06 DIAGNOSIS — I5021 Acute systolic (congestive) heart failure: Secondary | ICD-10-CM | POA: Diagnosis not present

## 2013-10-06 DIAGNOSIS — D638 Anemia in other chronic diseases classified elsewhere: Secondary | ICD-10-CM | POA: Diagnosis not present

## 2013-10-06 DIAGNOSIS — D509 Iron deficiency anemia, unspecified: Secondary | ICD-10-CM | POA: Diagnosis not present

## 2013-10-07 DIAGNOSIS — N183 Chronic kidney disease, stage 3 unspecified: Secondary | ICD-10-CM | POA: Diagnosis not present

## 2013-10-07 DIAGNOSIS — I509 Heart failure, unspecified: Secondary | ICD-10-CM | POA: Diagnosis not present

## 2013-10-07 DIAGNOSIS — I5021 Acute systolic (congestive) heart failure: Secondary | ICD-10-CM | POA: Diagnosis not present

## 2013-10-07 DIAGNOSIS — I4891 Unspecified atrial fibrillation: Secondary | ICD-10-CM | POA: Diagnosis not present

## 2013-10-07 DIAGNOSIS — N179 Acute kidney failure, unspecified: Secondary | ICD-10-CM | POA: Diagnosis not present

## 2013-10-07 DIAGNOSIS — D638 Anemia in other chronic diseases classified elsewhere: Secondary | ICD-10-CM | POA: Diagnosis not present

## 2013-10-08 DIAGNOSIS — I509 Heart failure, unspecified: Secondary | ICD-10-CM | POA: Diagnosis not present

## 2013-10-08 DIAGNOSIS — N183 Chronic kidney disease, stage 3 unspecified: Secondary | ICD-10-CM | POA: Diagnosis not present

## 2013-10-08 DIAGNOSIS — I5021 Acute systolic (congestive) heart failure: Secondary | ICD-10-CM | POA: Diagnosis not present

## 2013-10-08 DIAGNOSIS — N179 Acute kidney failure, unspecified: Secondary | ICD-10-CM | POA: Diagnosis not present

## 2013-10-08 DIAGNOSIS — D638 Anemia in other chronic diseases classified elsewhere: Secondary | ICD-10-CM | POA: Diagnosis not present

## 2013-10-12 ENCOUNTER — Ambulatory Visit (INDEPENDENT_AMBULATORY_CARE_PROVIDER_SITE_OTHER): Payer: Medicare Other | Admitting: Family Medicine

## 2013-10-12 ENCOUNTER — Encounter: Payer: Self-pay | Admitting: Family Medicine

## 2013-10-12 VITALS — BP 106/62 | HR 71 | Wt 191.0 lb

## 2013-10-12 DIAGNOSIS — M79606 Pain in leg, unspecified: Secondary | ICD-10-CM

## 2013-10-12 DIAGNOSIS — I5023 Acute on chronic systolic (congestive) heart failure: Secondary | ICD-10-CM | POA: Diagnosis not present

## 2013-10-12 DIAGNOSIS — D509 Iron deficiency anemia, unspecified: Secondary | ICD-10-CM | POA: Diagnosis not present

## 2013-10-12 DIAGNOSIS — M79609 Pain in unspecified limb: Secondary | ICD-10-CM | POA: Diagnosis not present

## 2013-10-12 DIAGNOSIS — Z09 Encounter for follow-up examination after completed treatment for conditions other than malignant neoplasm: Secondary | ICD-10-CM

## 2013-10-12 MED ORDER — OXYCODONE HCL 5 MG PO TABS: 5.0000 mg | ORAL_TABLET | Freq: Three times a day (TID) | ORAL | Status: DC | PRN

## 2013-10-12 NOTE — Progress Notes (Signed)
CC: Sean Benson is a 58 y.o. male is here for hospital f/u   Subjective: HPI:  Hospital followup for acute on chronic systolic congestive heart failure. Hospitalized between the 22nd and 26 of this month.  Acute on chronic systolic heart failure: Patient was diuresed on IV Lasix and began Zaroxolyn 2.5 mg on a daily basis. He has been instructed to follow a 1500 mL fluid restriction.  Echocardiogram was obtained showing no significant change compared to study months ago during past hospitalization.  Since discharge she states she's only taking Zaroxolyn every once in a while. He states he is weighing himself on a daily basis. Is uncertain whether or not he is sticking to a 1500 mL fluid restriction.  He has decided to take Zaroxolyn only as needed due to fears of dehydration. Denies orthopnea, chest pain, peripheral edema, nor PND.  While hospitalized was found to have hemoglobin of 6.2 which improved to 7.9 at time of discharge after 1 g of iron dextrose infusion. He was started on iron sulfate 325 on a daily basis which she's been taking at home. Additionally he has been taking thiamine and folic acid. He states that everyday he feels 10% better ever since getting his iron infusion. He's requesting an iron infusion today in our office. Hematology's recommendations when consult during his hospitalization was for iron, folate, multivitamin orally as outpatient.  Reports chronic shortness of breath however this is also improving on a 10% per day rate.  Since discharge denies fevers, chills, wheezing, chest pain, worsening shortness of breath, cough, confusion, abdominal pain, melena nor blood in stool.  He's requesting refills on oxycodone for management of chronic leg pain due to edema.   Review Of Systems Outlined In HPI  Past Medical History  Diagnosis Date  . Heart disease   . Heart attack     No past surgical history on file. Family History  Problem Relation Age of Onset  .  Alcoholism    . Diabetes Father     History   Social History  . Marital Status: Single    Spouse Name: N/A    Number of Children: N/A  . Years of Education: N/A   Occupational History  . Not on file.   Social History Main Topics  . Smoking status: Former Games developermoker  . Smokeless tobacco: Not on file  . Alcohol Use: Yes  . Drug Use: No  . Sexual Activity: Not Currently   Other Topics Concern  . Not on file   Social History Narrative  . No narrative on file     Objective: BP 106/62  Pulse 71  Wt 191 lb (86.637 kg)  SpO2 100%  General: Alert and Oriented, No Acute Distress HEENT: Pupils equal, round, reactive to light. Conjunctivae clear.  Moist mucous membranes pharynx unremarkable Lungs: Clear to auscultation bilaterally, no wheezing/ronchi/rales.  Comfortable work of breathing. Good air movement. Cardiac: Regular rate and rhythm. Normal S1/S2.  No murmurs, rubs, nor gallops.   Abdomen: Obese, soft, nontender, no palpable masses Extremities: 1+ nonpitting peripheral edema localized to the ankles and symmetric.  Strong peripheral pulses.  Mental Status: No depression, anxiety, nor agitation. Skin: Warm and dry.  Assessment & Plan: Sean AguasHoward was seen today for hospital f/u.  Diagnoses and associated orders for this visit:  Anemia, iron deficiency  Acute on chronic systolic heart failure  Lower extremity pain - oxyCODONE (OXY IR/ROXICODONE) 5 MG immediate release tablet; Take 1 tablet (5 mg total) by mouth every 8 (  eight) hours as needed (leg pain).    Iron deficiency anemia: Continue oral iron, oral folic acid and multivitamin.  We do not have the capability to provide an iron infusion today and discussed with patient that this was not part of the plan from hematology oncology. Encouraged to followup with hematology oncology if he believes that there is any confusion with this plan. Acute on chronic systolic heart failure: Exacerbation has improved, I strongly  encouraged him to follow his cardiologist recommendations specifically Zaroxolyn on a daily basis, but twice a day, 1500 mL fluid restriction, beta blocker. Weight self on a daily basis, contact also cardiology if any 2 pound weight gain over the course of the day.  40 minutes spent face-to-face during visit today of which at least 50% was counseling or coordinating care regarding: 1. Anemia, iron deficiency   2. Acute on chronic systolic heart failure   3. Lower extremity pain   4. Hospital discharge follow-up      Return if symptoms worsen or fail to improve.

## 2013-10-18 DIAGNOSIS — D638 Anemia in other chronic diseases classified elsewhere: Secondary | ICD-10-CM | POA: Diagnosis not present

## 2013-10-18 DIAGNOSIS — D509 Iron deficiency anemia, unspecified: Secondary | ICD-10-CM | POA: Diagnosis not present

## 2013-10-18 DIAGNOSIS — I509 Heart failure, unspecified: Secondary | ICD-10-CM | POA: Diagnosis not present

## 2013-10-18 DIAGNOSIS — I739 Peripheral vascular disease, unspecified: Secondary | ICD-10-CM | POA: Diagnosis not present

## 2013-10-18 DIAGNOSIS — R58 Hemorrhage, not elsewhere classified: Secondary | ICD-10-CM | POA: Diagnosis not present

## 2013-10-18 DIAGNOSIS — E876 Hypokalemia: Secondary | ICD-10-CM | POA: Diagnosis not present

## 2013-10-18 DIAGNOSIS — I5023 Acute on chronic systolic (congestive) heart failure: Secondary | ICD-10-CM | POA: Diagnosis not present

## 2013-10-18 DIAGNOSIS — I8390 Asymptomatic varicose veins of unspecified lower extremity: Secondary | ICD-10-CM | POA: Diagnosis not present

## 2013-10-19 ENCOUNTER — Ambulatory Visit (INDEPENDENT_AMBULATORY_CARE_PROVIDER_SITE_OTHER): Payer: Medicare Other | Admitting: Family Medicine

## 2013-10-19 ENCOUNTER — Encounter: Payer: Self-pay | Admitting: Family Medicine

## 2013-10-19 VITALS — BP 104/51 | HR 71 | Wt 192.0 lb

## 2013-10-19 DIAGNOSIS — L0291 Cutaneous abscess, unspecified: Secondary | ICD-10-CM | POA: Diagnosis not present

## 2013-10-19 DIAGNOSIS — I83899 Varicose veins of unspecified lower extremities with other complications: Secondary | ICD-10-CM

## 2013-10-19 DIAGNOSIS — I83893 Varicose veins of bilateral lower extremities with other complications: Secondary | ICD-10-CM | POA: Diagnosis not present

## 2013-10-19 DIAGNOSIS — L0390 Cellulitis, unspecified: Secondary | ICD-10-CM | POA: Diagnosis not present

## 2013-10-19 MED ORDER — CEPHALEXIN 500 MG PO CAPS
500.0000 mg | ORAL_CAPSULE | Freq: Three times a day (TID) | ORAL | Status: DC
Start: 2013-10-19 — End: 2013-11-21

## 2013-10-19 NOTE — Progress Notes (Signed)
CC: Sean Benson is a 58 y.o. male is here for check veins in leg   Subjective: HPI:  Patient is a bleeding from the right foot that began yesterday and was stopped within minutes after EMS arrived and applied pressure.  Symptoms began without warning and is localized to the right medial ankle where a scab had been sitting over a varicose vein. Since yesterday no bleeding and no other interventions other than keeping the wound tightly wound.  Symptoms were described as moderate to severe he is unable to estimate how much volume he lost. Since the episode he denies chest pain, shortness of breath, fatigue, orthopnea. View of systems positive for peripheral edema of both lower tremors   Review Of Systems Outlined In HPI  Past Medical History  Diagnosis Date  . Heart disease   . Heart attack     No past surgical history on file. Family History  Problem Relation Age of Onset  . Alcoholism    . Diabetes Father     History   Social History  . Marital Status: Single    Spouse Name: N/A    Number of Children: N/A  . Years of Education: N/A   Occupational History  . Not on file.   Social History Main Topics  . Smoking status: Former Games developermoker  . Smokeless tobacco: Not on file  . Alcohol Use: Yes  . Drug Use: No  . Sexual Activity: Not Currently   Other Topics Concern  . Not on file   Social History Narrative  . No narrative on file     Objective: BP 104/51  Pulse 71  Wt 192 lb (87.091 kg)  General: Alert and Oriented, No Acute Distress HEENT: Pupils equal, round, reactive to light. Conjunctivae clear.  Moist mucous membranes Lungs: Clear to auscultation bilaterally, no wheezing/ronchi/rales.  Comfortable work of breathing. Good air movement. Cardiac: Regular rate and rhythm. Normal S1/S2.  No murmurs, rubs, nor gallops.  . Extremities: 1+ pitting edema bilaterally from the ankles to the shins symmetric.  Strong peripheral pulses. Overlying a varicose vein above the  right medial malleolus there is scant bleeding in the center of a 1 cm patch of erythema without induration Mental Status: No depression, anxiety, nor agitation. Skin: Warm and dry.  Assessment & Plan: [name] was seen today for check veins in leg.  Diagnoses and associated orders for this visit:  Cellulitis - cephALEXin (KEFLEX) 500 MG capsule; Take 1 capsule (500 mg total) by mouth 3 (three) times daily.  Bleeding from varicose vein    Scant bleeding from a varicose vein was treated with silver nitrate which stopped bleeding completely. Due to mild suspicion of cellulitis surrounding this wound start Keflex.  Strongly encouraged to continue on diuretic regimen and use compression stockings to help expedite healing.  Wound was covered with sterile 4 x 4 gauze and compression wraps. He was given supplies to do the same for the next week on a daily basis  25 minutes spent face-to-face during visit today of which at least 50% was counseling or coordinating care regarding: 1. Cellulitis   2. Bleeding from varicose vein      Return if symptoms worsen or fail to improve.

## 2013-10-25 DIAGNOSIS — D509 Iron deficiency anemia, unspecified: Secondary | ICD-10-CM | POA: Diagnosis not present

## 2013-11-03 ENCOUNTER — Telehealth: Payer: Self-pay | Admitting: Family Medicine

## 2013-11-03 DIAGNOSIS — M79606 Pain in leg, unspecified: Secondary | ICD-10-CM

## 2013-11-03 MED ORDER — OXYCODONE HCL 5 MG PO TABS: 5.0000 mg | ORAL_TABLET | Freq: Three times a day (TID) | ORAL | Status: DC | PRN

## 2013-11-03 NOTE — Telephone Encounter (Signed)
Pt notified rx up front

## 2013-11-03 NOTE — Telephone Encounter (Signed)
Patient called and request to have med refill for Oxycodone 5mg s he will be out on Tuesday and is calling in advance for refill. Pt request the nurse to call when its ready for pick up and you can leave a message if he doesn't answer. Thanks

## 2013-11-03 NOTE — Telephone Encounter (Signed)
Andrea, Rx placed in in-box ready for pickup/faxing.  

## 2013-11-08 ENCOUNTER — Ambulatory Visit (INDEPENDENT_AMBULATORY_CARE_PROVIDER_SITE_OTHER): Payer: Medicare Other | Admitting: Family Medicine

## 2013-11-08 ENCOUNTER — Encounter: Payer: Self-pay | Admitting: Family Medicine

## 2013-11-08 ENCOUNTER — Telehealth: Payer: Self-pay | Admitting: *Deleted

## 2013-11-08 VITALS — BP 104/60 | HR 73 | Wt 194.0 lb

## 2013-11-08 DIAGNOSIS — M79609 Pain in unspecified limb: Secondary | ICD-10-CM

## 2013-11-08 DIAGNOSIS — L0390 Cellulitis, unspecified: Secondary | ICD-10-CM

## 2013-11-08 DIAGNOSIS — L0291 Cutaneous abscess, unspecified: Secondary | ICD-10-CM | POA: Diagnosis not present

## 2013-11-08 DIAGNOSIS — R0602 Shortness of breath: Secondary | ICD-10-CM

## 2013-11-08 DIAGNOSIS — M79606 Pain in leg, unspecified: Secondary | ICD-10-CM

## 2013-11-08 MED ORDER — DOXYCYCLINE HYCLATE 100 MG PO TABS: 100.0000 mg | ORAL_TABLET | Freq: Two times a day (BID) | ORAL | Status: DC

## 2013-11-08 MED ORDER — OXYCODONE HCL 5 MG PO TABS: 5.0000 mg | ORAL_TABLET | Freq: Three times a day (TID) | ORAL | Status: DC | PRN

## 2013-11-08 NOTE — Telephone Encounter (Signed)
[facility] called and states pt already had an oxycodone rx on file that was too early to fill. The pharmacist states they are not going to fill the rx he brought in today because there is no change in the directions and he is taking more than what is rx'ed. They want a call back to clarify the directions if you want him to take more than one tablet every eight hours.Please advise

## 2013-11-08 NOTE — Telephone Encounter (Signed)
Pt notified and he states he still has some tramadol at home

## 2013-11-08 NOTE — Progress Notes (Signed)
CC: Sean Benson is a 58 y.o. male is here for Ankle Pain and wound on leg   Subjective: HPI:  Complains of sores on both ankles that have been present for one week that seem to be enlarging, they're severely tender to the touch. There is been some clear discharge that has turned into a pus appearance over the past 2-3 days. Interventions have included Epsom salts, sterile gauze and started an old regimen of Keflex 2 days ago.. Nothing particularly seems to make this better. He denies bleeding, fevers, chills, nausea, nor new motor or sensory disturbances in either lower extremity. He admits that he took an additional one dose of his oxycodone on a daily basis for the past week due to pain in the legs.  As the shortness of breath has been present for the past 2 days. Has been accompanied by increasing abdominal bloating. Both of which have improved with taking an additional dose of furosemide. He is vague about what regimen he is taking a daily basis with respect to Lasix and Zaroxolyn.  Denies cough, wheezing, orthopnea, nor worsening peripheral edema     Review Of Systems Outlined In HPI  Past Medical History  Diagnosis Date  . Heart disease   . Heart attack     No past surgical history on file. Family History  Problem Relation Age of Onset  . Alcoholism    . Diabetes Father     History   Social History  . Marital Status: Single    Spouse Name: N/A    Number of Children: N/A  . Years of Education: N/A   Occupational History  . Not on file.   Social History Main Topics  . Smoking status: Former Games developer  . Smokeless tobacco: Not on file  . Alcohol Use: Yes  . Drug Use: No  . Sexual Activity: Not Currently   Other Topics Concern  . Not on file   Social History Narrative  . No narrative on file     Objective: BP 104/60  Pulse 73  Wt 194 lb (87.998 kg)  General: Alert and Oriented, No Acute Distress HEENT: Pupils equal, round, reactive to light. Conjunctivae  clear.  Moist mucous membranes pharynx unremarkable Lungs: Clear to auscultation bilaterally, no wheezing/ronchi/rales.  Comfortable work of breathing. Good air movement. Cardiac: Regular rate and rhythm. Normal S1/S2.  No murmurs, rubs, nor gallops.   Abdomen: Obese soft nontender, no rebound or guarding Extremities: 1+ pitting edema bilaterally and symmetric. Overlying the right medial malleoli is a 1 cm circular shallow ulceration without active bleeding. Just below the left medial malleoli there is a quarter centimeters diameter shallow ulceration without active bleeding. There is moderate erythema surrounding both of these wounds extending 1-2 cm in periphery   Strong peripheral pulses.  Mental Status: No depression, anxiety, nor agitation. Skin: Warm and dry.  Assessment & Plan: [name] was seen today for ankle pain and wound on leg.  Diagnoses and associated orders for this visit:  Cellulitis - doxycycline (VIBRA-TABS) 100 MG tablet; Take 1 tablet (100 mg total) by mouth 2 (two) times daily.  Lower extremity pain - oxyCODONE (OXY IR/ROXICODONE) 5 MG immediate release tablet; Take 1 tablet (5 mg total) by mouth every 8 (eight) hours as needed (leg pain).  Shortness of breath    Cellulitis: Continue Keflex however adding doxycycline for full coverage of staph and strep. Today his wound is covered with iodoform and then sterile gauze I've encouraged him to do this on  a daily basis for the next week with soap soaks one to 2 times a day. Lower extremity pain: Advised him on his oxycodone as prescribed, he has his typical refill that he can begin when June arrives Shortness of breath: Due to abdominal bloating secondary to congestive heart failure and cirrhosis encouraged him to take his furosemide and Zaroxolyn as prescribed and if he ever gains 2 pounds overnight or shortness of breath is worsening take an additional 40-80 mg of Lasix  Return in about 1 week (around 11/15/2013).

## 2013-11-08 NOTE — Telephone Encounter (Signed)
I only want him to take a single tablet once every eight hours.  He'll have to use tramadol until [facility] can refill his monthly rx, let me know if he needs any tramadol to last him until June.

## 2013-11-08 NOTE — Telephone Encounter (Signed)
Pharmacy notified to wait until oxy is due before filling

## 2013-11-10 ENCOUNTER — Telehealth: Payer: Self-pay | Admitting: *Deleted

## 2013-11-10 MED ORDER — CLINDAMYCIN HCL 300 MG PO CAPS: 300.0000 mg | ORAL_CAPSULE | Freq: Three times a day (TID) | ORAL | Status: DC

## 2013-11-10 NOTE — Telephone Encounter (Signed)
Pt informed.  Abigaelle Verley, LPN  

## 2013-11-10 NOTE — Telephone Encounter (Signed)
Pt states he went to pick up the Doxycycline is $60 and he is asking if we can send something cheaper that will still help. Meyer Cory, LPN

## 2013-11-10 NOTE — Telephone Encounter (Signed)
Yes, Clindamycin was sent to [facility] as a substitution.

## 2013-11-14 ENCOUNTER — Other Ambulatory Visit: Payer: Self-pay | Admitting: *Deleted

## 2013-11-14 MED ORDER — LOVASTATIN 20 MG PO TABS: 20.0000 mg | ORAL_TABLET | Freq: Every day | ORAL | Status: DC

## 2013-11-15 DIAGNOSIS — Z45018 Encounter for adjustment and management of other part of cardiac pacemaker: Secondary | ICD-10-CM | POA: Diagnosis not present

## 2013-11-15 DIAGNOSIS — I2589 Other forms of chronic ischemic heart disease: Secondary | ICD-10-CM | POA: Diagnosis not present

## 2013-11-15 DIAGNOSIS — Z95810 Presence of automatic (implantable) cardiac defibrillator: Secondary | ICD-10-CM | POA: Diagnosis not present

## 2013-11-21 ENCOUNTER — Ambulatory Visit (INDEPENDENT_AMBULATORY_CARE_PROVIDER_SITE_OTHER): Payer: Medicare Other | Admitting: Family Medicine

## 2013-11-21 ENCOUNTER — Encounter: Payer: Self-pay | Admitting: Family Medicine

## 2013-11-21 ENCOUNTER — Ambulatory Visit (INDEPENDENT_AMBULATORY_CARE_PROVIDER_SITE_OTHER): Payer: Medicare Other

## 2013-11-21 VITALS — BP 98/65 | HR 83 | Wt 194.0 lb

## 2013-11-21 DIAGNOSIS — M25571 Pain in right ankle and joints of right foot: Secondary | ICD-10-CM

## 2013-11-21 DIAGNOSIS — M25476 Effusion, unspecified foot: Secondary | ICD-10-CM

## 2013-11-21 DIAGNOSIS — G8929 Other chronic pain: Secondary | ICD-10-CM

## 2013-11-21 DIAGNOSIS — M25579 Pain in unspecified ankle and joints of unspecified foot: Secondary | ICD-10-CM | POA: Diagnosis not present

## 2013-11-21 DIAGNOSIS — D509 Iron deficiency anemia, unspecified: Secondary | ICD-10-CM

## 2013-11-21 DIAGNOSIS — L0390 Cellulitis, unspecified: Secondary | ICD-10-CM | POA: Diagnosis not present

## 2013-11-21 DIAGNOSIS — E876 Hypokalemia: Secondary | ICD-10-CM

## 2013-11-21 DIAGNOSIS — M79609 Pain in unspecified limb: Secondary | ICD-10-CM | POA: Diagnosis not present

## 2013-11-21 DIAGNOSIS — L0291 Cutaneous abscess, unspecified: Secondary | ICD-10-CM

## 2013-11-21 DIAGNOSIS — L97909 Non-pressure chronic ulcer of unspecified part of unspecified lower leg with unspecified severity: Principal | ICD-10-CM

## 2013-11-21 DIAGNOSIS — I83009 Varicose veins of unspecified lower extremity with ulcer of unspecified site: Secondary | ICD-10-CM

## 2013-11-21 DIAGNOSIS — M25473 Effusion, unspecified ankle: Secondary | ICD-10-CM

## 2013-11-21 DIAGNOSIS — M79606 Pain in leg, unspecified: Secondary | ICD-10-CM

## 2013-11-21 DIAGNOSIS — M25572 Pain in left ankle and joints of left foot: Secondary | ICD-10-CM

## 2013-11-21 LAB — BASIC METABOLIC PANEL WITH GFR
BUN: 31 mg/dL — ABNORMAL HIGH (ref 6–23)
CALCIUM: 8.9 mg/dL (ref 8.4–10.5)
CO2: 24 mEq/L (ref 19–32)
Chloride: 97 mEq/L (ref 96–112)
Creat: 1.75 mg/dL — ABNORMAL HIGH (ref 0.50–1.35)
GFR, EST NON AFRICAN AMERICAN: 43 mL/min — AB
GFR, Est African American: 49 mL/min — ABNORMAL LOW
Glucose, Bld: 76 mg/dL (ref 70–99)
Potassium: 3.6 mEq/L (ref 3.5–5.3)
Sodium: 133 mEq/L — ABNORMAL LOW (ref 135–145)

## 2013-11-21 LAB — IRON AND TIBC
%SAT: 23 % (ref 20–55)
IRON: 71 ug/dL (ref 42–165)
TIBC: 308 ug/dL (ref 215–435)
UIBC: 237 ug/dL (ref 125–400)

## 2013-11-21 MED ORDER — CEPHALEXIN 500 MG PO CAPS: 500.0000 mg | ORAL_CAPSULE | Freq: Three times a day (TID) | ORAL | Status: DC

## 2013-11-21 MED ORDER — LOVASTATIN 20 MG PO TABS: 40.0000 mg | ORAL_TABLET | Freq: Every day | ORAL | Status: AC

## 2013-11-21 MED ORDER — DOXYCYCLINE HYCLATE 100 MG PO TABS: 100.0000 mg | ORAL_TABLET | Freq: Two times a day (BID) | ORAL | Status: DC

## 2013-11-21 MED ORDER — PREGABALIN 50 MG PO CAPS: 50.0000 mg | ORAL_CAPSULE | Freq: Every day | ORAL | Status: DC

## 2013-11-21 NOTE — Patient Instructions (Signed)
-  Labs today  -Xray of the right ankle today  -Start Lyrica.    -Start Doxycycline that was sent to [facility] for infection control.  Change dressings with sterile gauze using xeroform overlying the wounds.  Wear compression stockings during waking hours.  Call me if not contacted about would care clinic by the end of the week.

## 2013-11-21 NOTE — Progress Notes (Signed)
CC: Sean Benson is a 58 y.o. male is here for f/u ankles   Subjective: HPI:  Followup cellulitis of the right foot. Patient with 10 days of Keflex and is currently one week into clindamycin, he was unable to afford doxycycline which was initially prescribed. He does not believe that his wound is getting any better and believes that he has a growing wound on the left leg as well. Both wounds bleed with gentle manipulation have a constant serous discharge and occasionally purulent discharge with manipulation. He tells me he has been covering it with a sterile bandage replaced every one to 2 days. States the right wound is tender mild/moderate severity left wound is tender moderate to severe in severity. Pain on the left is worse with weightbearing.  Denies fevers, chills, flushing, rapid heartbeat, nor nausea. He had to call EMS last night due to bleeding from the left leg wound.  Complains of continued chronic leg pain is described as burning and tingling in both legs from the toes up into the hips, slight improvement with oxycodone but he feels that his current regimen is not sufficiently treating the pain, tramadol provided no benefit. Symptoms are mild in severity with oxycodone, moderate to severe when not taking the medication. Denies any other motor or sensory disturbances in the lower extremities  He is uncertain who is going to be monitoring his iron deficiency anemia.  He continues on iron supplements daily along with folic acid.  Followup hypokalemia: Most recent basic metabolic panel revealed potassium of 3.1 just prior to discharge at [facility]. He continues to use Lasix and Zaroxolyn for lower extremity edema. Denies cramping or irregular heartbeat       Review Of Systems Outlined In HPI  Past Medical History  Diagnosis Date  . Heart disease   . Heart attack     No past surgical history on file. Family History  Problem Relation Age of Onset  . Alcoholism    . Diabetes  Father     History   Social History  . Marital Status: Single    Spouse Name: N/A    Number of Children: N/A  . Years of Education: N/A   Occupational History  . Not on file.   Social History Main Topics  . Smoking status: Former Games developer  . Smokeless tobacco: Not on file  . Alcohol Use: Yes  . Drug Use: No  . Sexual Activity: Not Currently   Other Topics Concern  . Not on file   Social History Narrative  . No narrative on file     Objective: BP 98/65  Pulse 83  Wt 194 lb (87.998 kg)  General: Alert and Oriented, No Acute Distress HEENT: Pupils equal, round, reactive to light. Conjunctivae clear.  Moist mucous membranes pharynx unremarkable Lungs: Clear to auscultation bilaterally, no wheezing/ronchi/rales.  Comfortable work of breathing. Good air movement. Cardiac: Regular rate and rhythm. Normal S1/S2.  No murmurs, rubs, nor gallops.   Extremities: 1+ bilateral pitting edema and symmetric.  Strong peripheral pulses.  Mental Status: No depression, anxiety, nor agitation. Skin: Warm and dry.  Overlying the left medial malleoli there is a 2 cm diameter shallow ulceration with mild surrounding erythema exquisitely tender to the touch no active bleeding. Overlying the right medial malleoli there is a 1 cm diameter shallow ulceration with central fleshy growth extending approximately 3 mm with no active bleeding but moderate surrounding erythema.  Assessment & Plan: Stone was seen today for f/u ankles.  Diagnoses  and associated orders for this visit:  Venous stasis ulcer - Ambulatory referral to Wound Clinic  Cellulitis - cephALEXin (KEFLEX) 500 MG capsule; Take 1 capsule (500 mg total) by mouth 3 (three) times daily. - doxycycline (VIBRA-TABS) 100 MG tablet; Take 1 tablet (100 mg total) by mouth 2 (two) times daily.  Right ankle pain - DG Ankle Complete Left; Future  Chronic leg pain - pregabalin (LYRICA) 50 MG capsule; Take 1 capsule (50 mg total) by mouth daily.  For leg pain.  Anemia, iron deficiency - Iron and TIBC  Hypokalemia - BASIC METABOLIC PANEL WITH GFR  Left ankle pain  Other Orders - lovastatin (MEVACOR) 20 MG tablet; Take 2 tablets (40 mg total) by mouth daily.    I believe both wounds are venous stasis ulcers that show some signs of infection have encouraged him to stop taking clindamycin instead start doxycycline for better staph and strep coverage. Strongly recommended that he start to see wound care for more specialized management, he prefers to go to [facility] and a referral has been placed. In clinic today his wounds were covered with iodoform, sterile gauze, Coban compression wraps. I asked him to repeat this on a daily basis and to wear compression stockings during all waking hours until he sees wound care. Left ankle pain: Checking x-ray to screen for osteomyelitis, please disregard right ankle pain diagnosis above Iron deficiency anemia: Checking iron levels Hypokalemia, due for metabolic panel Chronic leg pain: Start low-dose Lyrica at night and take oxycodone only as prescribed  40 minutes spent face-to-face during visit today of which at least 50% was counseling or coordinating care regarding: 1. Venous stasis ulcer   2. Cellulitis   3. Right ankle pain   4. Chronic leg pain   5. Anemia, iron deficiency   6. Hypokalemia   7. Left ankle pain       Return in about 1 week (around 11/28/2013).

## 2013-11-30 DIAGNOSIS — I739 Peripheral vascular disease, unspecified: Secondary | ICD-10-CM | POA: Diagnosis not present

## 2013-11-30 DIAGNOSIS — K709 Alcoholic liver disease, unspecified: Secondary | ICD-10-CM | POA: Diagnosis not present

## 2013-11-30 DIAGNOSIS — L97909 Non-pressure chronic ulcer of unspecified part of unspecified lower leg with unspecified severity: Secondary | ICD-10-CM | POA: Diagnosis not present

## 2013-11-30 DIAGNOSIS — I2589 Other forms of chronic ischemic heart disease: Secondary | ICD-10-CM | POA: Diagnosis not present

## 2013-11-30 DIAGNOSIS — L97309 Non-pressure chronic ulcer of unspecified ankle with unspecified severity: Secondary | ICD-10-CM | POA: Diagnosis not present

## 2013-12-01 ENCOUNTER — Encounter: Payer: Self-pay | Admitting: Family Medicine

## 2013-12-01 DIAGNOSIS — IMO0002 Reserved for concepts with insufficient information to code with codable children: Secondary | ICD-10-CM | POA: Insufficient documentation

## 2013-12-07 DIAGNOSIS — I739 Peripheral vascular disease, unspecified: Secondary | ICD-10-CM | POA: Diagnosis not present

## 2013-12-07 DIAGNOSIS — I872 Venous insufficiency (chronic) (peripheral): Secondary | ICD-10-CM | POA: Diagnosis not present

## 2013-12-11 DIAGNOSIS — I739 Peripheral vascular disease, unspecified: Secondary | ICD-10-CM | POA: Diagnosis not present

## 2013-12-11 DIAGNOSIS — L97509 Non-pressure chronic ulcer of other part of unspecified foot with unspecified severity: Secondary | ICD-10-CM | POA: Diagnosis not present

## 2013-12-11 DIAGNOSIS — I2589 Other forms of chronic ischemic heart disease: Secondary | ICD-10-CM | POA: Diagnosis not present

## 2013-12-11 DIAGNOSIS — I499 Cardiac arrhythmia, unspecified: Secondary | ICD-10-CM | POA: Diagnosis not present

## 2013-12-11 DIAGNOSIS — F17200 Nicotine dependence, unspecified, uncomplicated: Secondary | ICD-10-CM | POA: Diagnosis not present

## 2013-12-11 DIAGNOSIS — L97909 Non-pressure chronic ulcer of unspecified part of unspecified lower leg with unspecified severity: Secondary | ICD-10-CM | POA: Diagnosis not present

## 2013-12-11 DIAGNOSIS — K709 Alcoholic liver disease, unspecified: Secondary | ICD-10-CM | POA: Diagnosis not present

## 2013-12-11 DIAGNOSIS — L97309 Non-pressure chronic ulcer of unspecified ankle with unspecified severity: Secondary | ICD-10-CM | POA: Diagnosis not present

## 2013-12-13 ENCOUNTER — Telehealth: Payer: Self-pay | Admitting: *Deleted

## 2013-12-13 MED ORDER — OXYCODONE HCL 5 MG PO TABS: 5.0000 mg | ORAL_TABLET | Freq: Three times a day (TID) | ORAL | Status: DC | PRN

## 2013-12-13 NOTE — Telephone Encounter (Signed)
Pt request refill of oxycodone. Will ask Dr. Karie Schwalbe to sign this since Ivan AnchorsHommel is out of the office. rx place in Dr. Melvia Heaps's box

## 2013-12-18 DIAGNOSIS — L97909 Non-pressure chronic ulcer of unspecified part of unspecified lower leg with unspecified severity: Secondary | ICD-10-CM | POA: Diagnosis not present

## 2013-12-18 DIAGNOSIS — L97309 Non-pressure chronic ulcer of unspecified ankle with unspecified severity: Secondary | ICD-10-CM | POA: Diagnosis not present

## 2013-12-18 DIAGNOSIS — I872 Venous insufficiency (chronic) (peripheral): Secondary | ICD-10-CM | POA: Diagnosis not present

## 2013-12-18 DIAGNOSIS — I743 Embolism and thrombosis of arteries of the lower extremities: Secondary | ICD-10-CM | POA: Diagnosis not present

## 2013-12-18 DIAGNOSIS — I83009 Varicose veins of unspecified lower extremity with ulcer of unspecified site: Secondary | ICD-10-CM | POA: Diagnosis not present

## 2013-12-20 ENCOUNTER — Ambulatory Visit (INDEPENDENT_AMBULATORY_CARE_PROVIDER_SITE_OTHER): Payer: Medicare Other | Admitting: Family Medicine

## 2013-12-20 ENCOUNTER — Encounter: Payer: Self-pay | Admitting: Family Medicine

## 2013-12-20 VITALS — BP 105/61 | HR 73

## 2013-12-20 DIAGNOSIS — I83009 Varicose veins of unspecified lower extremity with ulcer of unspecified site: Secondary | ICD-10-CM | POA: Diagnosis not present

## 2013-12-20 DIAGNOSIS — L97909 Non-pressure chronic ulcer of unspecified part of unspecified lower leg with unspecified severity: Secondary | ICD-10-CM | POA: Diagnosis not present

## 2013-12-20 DIAGNOSIS — IMO0002 Reserved for concepts with insufficient information to code with codable children: Secondary | ICD-10-CM

## 2013-12-20 MED ORDER — AMBULATORY NON FORMULARY MEDICATION
Status: AC
Start: 2013-12-20 — End: ?

## 2013-12-20 MED ORDER — CEPHALEXIN 500 MG PO CAPS: 500.0000 mg | ORAL_CAPSULE | Freq: Four times a day (QID) | ORAL | Status: AC

## 2013-12-20 MED ORDER — OXYCODONE HCL 5 MG PO TABS: 5.0000 mg | ORAL_TABLET | Freq: Three times a day (TID) | ORAL | Status: DC | PRN

## 2013-12-20 NOTE — Progress Notes (Signed)
CC: Elwyn LadeHoward F Barro is a 58 y.o. male is here for f/u leg wounds   Subjective: HPI:  Followup chronic venous stasis ulceration bilateral extremities: States the pain is continuing to be moderate to severe in severity worse with touch or manipulation of either wound on the right or left lower extremity. Gets moderate relief with taking oxycodone 3 times a day. Lyrica was helpful however too expensive to take on a daily basis.  Since I saw him last he establish with vascular surgery [facility], he is distraught ever since that visit because his surgeon mention that an amputation may be required in the future. Understandably he has many questions regarding whether or not he needs to get an amputation. He's also establish with plastic surgery and has developed a great rapport with his current Engineer, petroleumplastic surgeon. Right now the plan is weekly visits with what he understands to be dressing changes every one to 2 days. They're also doing some debridement at these visits. Earlier this week he was given a prescription for clindamycin however has not refilled it yet. He was also given a prescription for a few days of oxycodone and has not filled it yet since he does not want to raise any red flags with her providers providing him with a narcotic.  He denies fevers, chills, flushing, rapid heartbeat, nausea, new extremity pain, new wounds, nor joint pain.  Review Of Systems Outlined In HPI  Past Medical History  Diagnosis Date  . Heart disease   . Heart attack     No past surgical history on file. Family History  Problem Relation Age of Onset  . Alcoholism    . Diabetes Father     History   Social History  . Marital Status: Single    Spouse Name: N/A    Number of Children: N/A  . Years of Education: N/A   Occupational History  . Not on file.   Social History Main Topics  . Smoking status: Former Games developermoker  . Smokeless tobacco: Not on file  . Alcohol Use: Yes   . Drug Use: No  . Sexual Activity: Not Currently   Other Topics Concern  . Not on file   Social History Narrative  . No narrative on file     Objective: BP 105/61  Pulse 73  General: Alert and Oriented, No Acute Distress HEENT: Pupils equal, round, reactive to light. Conjunctivae clear.  Moist membranes pharynx unremarkable Lungs: Clear to auscultation bilaterally, no wheezing/ronchi/rales.  Comfortable work of breathing. Good air movement. Cardiac: Regular rate and rhythm. Normal S1/S2.  No murmurs, rubs, nor gallops.   Extremities: 1+ pitting edema bilaterally localized to the ankles oxalate to the middle of the shin, symmetric.  Absent peripheral pulses in the lower remedies Mental Status: No depression, anxiety, nor agitation. Skin: Warm and dry. Overlying the left medial malleoli there is a 2 cm diameter shallow ulceration extremely tender to touch, overlying the right medial malleoli there is 2 somewhat confluent 2 cm diameter shallow ulcerations extremely tender to the touch without exudates with mild central granulation tissue.  Assessment & Plan: Sean Benson was seen today for f/u leg wounds.  Diagnoses and associated orders for this visit:  Chronic wound of extremity - oxyCODONE (OXY IR/ROXICODONE) 5 MG immediate release tablet; Take 1 tablet (5 mg total) by mouth every 8 (eight) hours as needed (leg pain).  Other Orders - cephALEXin (KEFLEX) 500 MG capsule; Take 1 capsule (500 mg total) by  mouth 4 (four) times daily. Only if needed for worsening infection. - AMBULATORY NON FORMULARY MEDICATION; Rolling Walker With Seat: Dx: Deconditioning, Lower Extremity Weakness    Chronic wound of extremity: He was redressed with iodoform gauze overlying EXTREMITIES, a nonstick gauze placed on top of this and then finally wrapped in Coban.he was also given supplies that he can do this at home on a daily basis until he sees wound care for continued followup. We had a long discussion of  the risks and benefits of amputation with a joint decision that he should work with wound care as much as he can to avoid the need for an amputation however at this time I reassured him that he does not need an amputation unless he has uncontrolled infections or bleeding.  Provided with a month of oxycodone, he appears to be taking this responsibly, the prescription he came in with was destroyed.  40 minutes spent face-to-face during visit today of which at least 50% was counseling or coordinating care regarding: 1. Chronic wound of extremity      Return in about 3 months (around 03/22/2014).

## 2014-01-01 DIAGNOSIS — I771 Stricture of artery: Secondary | ICD-10-CM | POA: Diagnosis not present

## 2014-01-01 DIAGNOSIS — N189 Chronic kidney disease, unspecified: Secondary | ICD-10-CM | POA: Diagnosis not present

## 2014-01-01 DIAGNOSIS — I872 Venous insufficiency (chronic) (peripheral): Secondary | ICD-10-CM | POA: Diagnosis not present

## 2014-01-01 DIAGNOSIS — E785 Hyperlipidemia, unspecified: Secondary | ICD-10-CM | POA: Diagnosis not present

## 2014-01-01 DIAGNOSIS — I743 Embolism and thrombosis of arteries of the lower extremities: Secondary | ICD-10-CM | POA: Diagnosis not present

## 2014-01-01 DIAGNOSIS — Z95810 Presence of automatic (implantable) cardiac defibrillator: Secondary | ICD-10-CM | POA: Diagnosis not present

## 2014-01-01 DIAGNOSIS — L97909 Non-pressure chronic ulcer of unspecified part of unspecified lower leg with unspecified severity: Secondary | ICD-10-CM | POA: Diagnosis not present

## 2014-01-01 DIAGNOSIS — I129 Hypertensive chronic kidney disease with stage 1 through stage 4 chronic kidney disease, or unspecified chronic kidney disease: Secondary | ICD-10-CM | POA: Diagnosis not present

## 2014-01-05 ENCOUNTER — Telehealth: Payer: Self-pay | Admitting: Family Medicine

## 2014-01-05 DIAGNOSIS — I779 Disorder of arteries and arterioles, unspecified: Secondary | ICD-10-CM | POA: Insufficient documentation

## 2014-01-05 DIAGNOSIS — I5022 Chronic systolic (congestive) heart failure: Secondary | ICD-10-CM

## 2014-01-05 DIAGNOSIS — IMO0002 Reserved for concepts with insufficient information to code with codable children: Secondary | ICD-10-CM

## 2014-01-05 MED ORDER — AMBULATORY NON FORMULARY MEDICATION: Status: AC

## 2014-01-05 NOTE — Telephone Encounter (Signed)
Dr. Ivan AnchorsHommel  Mr. Sean Benson called and said that because of the sores on his feet and he can not elevate them and has to have them hanging down he needs a hospital bed.  He wanted to know if you could write him a prescription for the bed and fax it to [facility] at [facility] - [phone] if you have any questions for them the P - [phone] and he has been speaking with T[place]aga there.

## 2014-01-05 NOTE — Telephone Encounter (Signed)
Andrea, Rx placed in in-box ready for pickup/faxing.  

## 2014-01-05 NOTE — Telephone Encounter (Signed)
rx faxed and left message on pt's vm

## 2014-01-08 NOTE — Telephone Encounter (Signed)
Sean Benson, Will you please let patient know that it appears Medicare will not cover a hospital bed since he is not immobile.  I can still write an order (semi-electric hospital bed) for it but it appears he will have to pay for it out of pocket.

## 2014-01-08 NOTE — Telephone Encounter (Signed)
Pt notified and declined hospital bed

## 2014-01-11 ENCOUNTER — Telehealth: Payer: Self-pay | Admitting: *Deleted

## 2014-01-11 NOTE — Telephone Encounter (Signed)
Pt called yesterday but didn't say what he wanted. Tried to call him back but no vm. Pt needs to leave a detailed message as to what he needs so I can try to have an answer for him when I call back

## 2014-01-11 NOTE — Telephone Encounter (Signed)
Pt requests refill of oxycodone. I advised pt that this rx is not due until 01-20-14. Pt states he will be out of medications for one week by then. I advised that if he is taking the medication as the directions read on rx then he should not be out of medications. The directions state to take the medication as needed. Pt states I needed to call the pharm because our records are messed up. I told him that I would do just that. Pt states he didn't get rx filled that was printed on 7-1. [facility] called [facility] and they confirmed that rx's were filled on 7-1 and 7-8 for oxycodone. I called the pt and told him this and I reiterated to him that it is important to take the medications as they are rx'ed. He asked if there was something else that could be rx'ed to him. I told him that Oxycodone is the strongest thing that Hommel rx's and beyond this he needs to be in the care of pain management and get meds from them only. I asked him to call and check the status of referral to [facility]  that [facility]. Pt voiced understanding

## 2014-01-15 DIAGNOSIS — L97909 Non-pressure chronic ulcer of unspecified part of unspecified lower leg with unspecified severity: Secondary | ICD-10-CM | POA: Diagnosis not present

## 2014-01-15 DIAGNOSIS — I743 Embolism and thrombosis of arteries of the lower extremities: Secondary | ICD-10-CM | POA: Diagnosis not present

## 2014-01-19 ENCOUNTER — Telehealth: Payer: Self-pay | Admitting: *Deleted

## 2014-01-19 DIAGNOSIS — IMO0002 Reserved for concepts with insufficient information to code with codable children: Secondary | ICD-10-CM

## 2014-01-19 MED ORDER — OXYCODONE HCL 5 MG PO TABS: 5.0000 mg | ORAL_TABLET | Freq: Three times a day (TID) | ORAL | Status: DC | PRN

## 2014-01-19 NOTE — Telephone Encounter (Signed)
Pt requests refill of oxycodone.  

## 2014-01-22 ENCOUNTER — Telehealth: Payer: Self-pay | Admitting: Family Medicine

## 2014-01-22 DIAGNOSIS — IMO0002 Reserved for concepts with insufficient information to code with codable children: Secondary | ICD-10-CM

## 2014-01-22 DIAGNOSIS — I779 Disorder of arteries and arterioles, unspecified: Secondary | ICD-10-CM

## 2014-01-22 NOTE — Telephone Encounter (Signed)
Sue LushAndrea, Referral placed, slip up front.

## 2014-01-22 NOTE — Telephone Encounter (Signed)
Pt called.  He wants referral for pain management.  He wants to go to [facility] on [address]  Thank you

## 2014-01-23 ENCOUNTER — Ambulatory Visit: Payer: Medicare Other | Admitting: Family Medicine

## 2014-01-23 NOTE — Telephone Encounter (Signed)
Pt.notified

## 2014-01-25 ENCOUNTER — Telehealth: Payer: Self-pay | Admitting: Family Medicine

## 2014-01-25 NOTE — Telephone Encounter (Signed)
Dr Ivan AnchorsHommel, a fax came in from [facility] stating there are no treatment options available for Mr. Sean Benson. Advised patient. Patient states he will get with you.

## 2014-02-13 ENCOUNTER — Encounter: Payer: Self-pay | Admitting: Family Medicine

## 2014-02-13 ENCOUNTER — Ambulatory Visit (INDEPENDENT_AMBULATORY_CARE_PROVIDER_SITE_OTHER): Payer: Medicare Other | Admitting: Family Medicine

## 2014-02-13 VITALS — BP 117/58 | HR 66 | Wt 175.0 lb

## 2014-02-13 DIAGNOSIS — IMO0002 Reserved for concepts with insufficient information to code with codable children: Secondary | ICD-10-CM

## 2014-02-13 DIAGNOSIS — I779 Disorder of arteries and arterioles, unspecified: Secondary | ICD-10-CM

## 2014-02-13 DIAGNOSIS — I743 Embolism and thrombosis of arteries of the lower extremities: Secondary | ICD-10-CM

## 2014-02-13 MED ORDER — OXYCODONE HCL 5 MG PO TABS
5.0000 mg | ORAL_TABLET | Freq: Three times a day (TID) | ORAL | Status: DC | PRN
Start: 2014-02-13 — End: 2014-03-19

## 2014-02-13 MED ORDER — SILVER SULFADIAZINE 1 % EX CREA: 1.0000 "application " | TOPICAL_CREAM | Freq: Every day | CUTANEOUS | Status: AC

## 2014-02-13 MED ORDER — GABAPENTIN 300 MG PO CAPS: 300.0000 mg | ORAL_CAPSULE | Freq: Three times a day (TID) | ORAL | Status: DC

## 2014-02-13 NOTE — Progress Notes (Signed)
CC: Sean Benson is a 58 y.o. male is here for f/u ulcers on legs   Subjective: HPI:  Followup chronic wounds of both legs in the setting of CHF, peripheral artery disease. Patient states the pain is still described as an electric sensation with a stabbing component as well moderate in severity after taking an oxycodone and severe in severity 6-8 hours after taking the medication.  Patient reports that he often ends up screaming throughout the night due to the pain. It is worse with manipulation of the wound. He has been seen by wound therapy at [facility] and his left mother regimen of using Dakins solution and covering with sterile gauze, repeating this daily. Patient does not think that he is making much progress and based on outside notes sounds like the physicians there do not think he is making much progress either. He's upset because he thinks that there must be some intervention [name] that is being withheld from him for reasons he is unsure of.  He denies fevers, chills, flushing, abdominal pain, confusion. He's had some discharge from his wounds however it's described as a straw-colored fluid without any purulence.  Review Of Systems Outlined In HPI  Past Medical History  Diagnosis Date  . Heart disease   . Heart attack     No past surgical history on file. Family History  Problem Relation Age of Onset  . Alcoholism    . Diabetes Father     History   Social History  . Marital Status: Single    Spouse Name: N/A    Number of Children: N/A  . Years of Education: N/A   Occupational History  . Not on file.   Social History Main Topics  . Smoking status: Former Games developer  . Smokeless tobacco: Not on file  . Alcohol Use: Yes  . Drug Use: No  . Sexual Activity: Not Currently   Other Topics Concern  . Not on file   Social History Narrative  . No narrative on file     Objective: BP 117/58  Pulse 66  Wt 175 lb (79.379 kg)  General: Alert and Oriented, No Acute  Distress HEENT: Pupils equal, round, reactive to light. Conjunctivae clear.  Moist the December and pharynx unremarkable Lungs: Clear to auscultation bilaterally, no wheezing/ronchi/rales.  Comfortable work of breathing. Good air movement. Cardiac: Regular rate and rhythm. Normal S1/S2.  No murmurs, rubs, nor gallops.   Extremities: 1+ pitting edema in both lower extremities from the shins distally.  Strong peripheral pulses.  Mental Status: No depression, anxiety, nor agitation. Skin: Warm and dry.  Assessment & Plan: Buren was seen today for f/u ulcers on legs.  Diagnoses and associated orders for this visit:  Chronic wound of extremity - gabapentin (NEURONTIN) 300 MG capsule; Take 1 capsule (300 mg total) by mouth 3 (three) times daily. To prevent nerve pain. - oxyCODONE (OXY IR/ROXICODONE) 5 MG immediate release tablet; Take 1 tablet (5 mg total) by mouth every 8 (eight) hours as needed (leg pain). May fill on/after September 5th 2015  PAOD (peripheral arterial occlusive disease)  Other Orders - silver sulfADIAZINE (SILVADENE) 1 % cream; Apply 1 application topically daily.    Chronic wound of the extremity: Discussed with patient at length that I do not think anybody is withholding any therapy that has the potential to help heal his wounds. Discussed that there are interventions for her review is a poor candidate for revascularization, grafting, etc. and the best we can do  is to try to provide an environment in the wound bed to facilitate natural healing of the body however this is complicated greatly by peripheral artery disease and lower extremity edema.  I encouraged him to continue what he is doing presently for his wound care but to add silver sulfadiazine to see if this provides any benefit for healing however prepared him that this is unlikely to be the magic bullet.  For his pain I encouraged him to follow through with his pain management appointment on the 22nd, continue  oxycodone as needed pending the recommendations from that visit and begin gabapentin as well. Amitriptyline will be the next step if gabapentin fails to provide any benefit  40 minutes spent face-to-face during visit today of which at least 50% was counseling or coordinating care regarding: 1. Chronic wound of extremity   2. PAOD (peripheral arterial occlusive disease)      Return in about 4 weeks (around 03/13/2014) for Wounds.

## 2014-02-14 ENCOUNTER — Telehealth: Payer: Self-pay | Admitting: *Deleted

## 2014-02-14 MED ORDER — AMITRIPTYLINE HCL 25 MG PO TABS: ORAL_TABLET | ORAL | Status: AC

## 2014-02-14 NOTE — Telephone Encounter (Signed)
Davin calls today stating that the gabapentin is increasing his pain. He said that it has made everything worse and he understands that he needs to get used to it but he said that it made him hurt so bad he refused to get out of bed to use bathroom and just "held it". He also said that it is making him sweat and is unsure if this is normal side effect. Please advise. Corliss Skains, CMA

## 2014-02-14 NOTE — Telephone Encounter (Signed)
Not a usual side effect but not something I want him to endure either.  Stop immediately if symptoms persist and switch to amitriptyline that I've sent to his [facility].

## 2014-02-15 DIAGNOSIS — I2589 Other forms of chronic ischemic heart disease: Secondary | ICD-10-CM | POA: Diagnosis not present

## 2014-02-15 DIAGNOSIS — Z4502 Encounter for adjustment and management of automatic implantable cardiac defibrillator: Secondary | ICD-10-CM | POA: Diagnosis not present

## 2014-02-15 DIAGNOSIS — Z95810 Presence of automatic (implantable) cardiac defibrillator: Secondary | ICD-10-CM | POA: Diagnosis not present

## 2014-02-15 NOTE — Telephone Encounter (Signed)
I spoke with pt at length regarding amitriptyline and how it may make him feel at first. He is going to stop taking the gabapentin at this time. Corliss Skains, CMA

## 2014-03-06 DIAGNOSIS — I743 Embolism and thrombosis of arteries of the lower extremities: Secondary | ICD-10-CM | POA: Diagnosis not present

## 2014-03-06 DIAGNOSIS — L97909 Non-pressure chronic ulcer of unspecified part of unspecified lower leg with unspecified severity: Secondary | ICD-10-CM | POA: Diagnosis not present

## 2014-03-06 DIAGNOSIS — M79609 Pain in unspecified limb: Secondary | ICD-10-CM | POA: Diagnosis not present

## 2014-03-06 DIAGNOSIS — I739 Peripheral vascular disease, unspecified: Secondary | ICD-10-CM | POA: Diagnosis not present

## 2014-03-09 ENCOUNTER — Other Ambulatory Visit: Payer: Self-pay | Admitting: *Deleted

## 2014-03-09 ENCOUNTER — Telehealth: Payer: Self-pay | Admitting: *Deleted

## 2014-03-09 NOTE — Telephone Encounter (Signed)
Patient aware. Corliss Skains, CMA

## 2014-03-09 NOTE — Telephone Encounter (Signed)
Colon called this afternoon stating that he has started taking the gabapentin again. He said that he takes 2 pills morning and evening and 1 at lunch time for a total of 5 pills daily. He states that they are helping his pain. He started taking the gabapentin again as a recommendation from the pain clinic. He just wanted you to be aware and make sure you were okay with him taking again. Corliss Skains, CMA

## 2014-03-09 NOTE — Telephone Encounter (Signed)
Yes I think that is a great idea thank you for the update

## 2014-03-13 ENCOUNTER — Ambulatory Visit: Payer: Medicare Other | Admitting: Family Medicine

## 2014-03-15 ENCOUNTER — Telehealth: Payer: Self-pay | Admitting: *Deleted

## 2014-03-15 NOTE — Telephone Encounter (Signed)
Pt called and states he needed a refill on his oxy. Its not due until the 5th of this month

## 2014-03-19 ENCOUNTER — Other Ambulatory Visit: Payer: Self-pay | Admitting: *Deleted

## 2014-03-19 MED ORDER — OXYCODONE HCL 5 MG PO TABS: 5.0000 mg | ORAL_TABLET | Freq: Three times a day (TID) | ORAL | Status: DC | PRN

## 2014-04-03 DIAGNOSIS — M79606 Pain in leg, unspecified: Secondary | ICD-10-CM | POA: Diagnosis not present

## 2014-04-03 DIAGNOSIS — J90 Pleural effusion, not elsewhere classified: Secondary | ICD-10-CM | POA: Diagnosis not present

## 2014-04-03 DIAGNOSIS — I872 Venous insufficiency (chronic) (peripheral): Secondary | ICD-10-CM | POA: Diagnosis not present

## 2014-04-03 DIAGNOSIS — Z515 Encounter for palliative care: Secondary | ICD-10-CM | POA: Diagnosis not present

## 2014-04-03 DIAGNOSIS — I2510 Atherosclerotic heart disease of native coronary artery without angina pectoris: Secondary | ICD-10-CM | POA: Diagnosis not present

## 2014-04-03 DIAGNOSIS — J9811 Atelectasis: Secondary | ICD-10-CM | POA: Diagnosis not present

## 2014-04-03 DIAGNOSIS — L97921 Non-pressure chronic ulcer of unspecified part of left lower leg limited to breakdown of skin: Secondary | ICD-10-CM | POA: Diagnosis not present

## 2014-04-03 DIAGNOSIS — R0602 Shortness of breath: Secondary | ICD-10-CM | POA: Diagnosis not present

## 2014-04-03 DIAGNOSIS — L97919 Non-pressure chronic ulcer of unspecified part of right lower leg with unspecified severity: Secondary | ICD-10-CM | POA: Diagnosis not present

## 2014-04-03 DIAGNOSIS — L97929 Non-pressure chronic ulcer of unspecified part of left lower leg with unspecified severity: Secondary | ICD-10-CM | POA: Diagnosis not present

## 2014-04-03 DIAGNOSIS — I739 Peripheral vascular disease, unspecified: Secondary | ICD-10-CM | POA: Diagnosis not present

## 2014-04-03 DIAGNOSIS — L97911 Non-pressure chronic ulcer of unspecified part of right lower leg limited to breakdown of skin: Secondary | ICD-10-CM | POA: Diagnosis not present

## 2014-04-04 DIAGNOSIS — I493 Ventricular premature depolarization: Secondary | ICD-10-CM | POA: Diagnosis not present

## 2014-04-05 DIAGNOSIS — M79605 Pain in left leg: Secondary | ICD-10-CM | POA: Diagnosis not present

## 2014-04-05 DIAGNOSIS — L97911 Non-pressure chronic ulcer of unspecified part of right lower leg limited to breakdown of skin: Secondary | ICD-10-CM | POA: Diagnosis not present

## 2014-04-05 DIAGNOSIS — Z515 Encounter for palliative care: Secondary | ICD-10-CM | POA: Diagnosis not present

## 2014-04-05 DIAGNOSIS — M79604 Pain in right leg: Secondary | ICD-10-CM | POA: Diagnosis not present

## 2014-04-05 DIAGNOSIS — I739 Peripheral vascular disease, unspecified: Secondary | ICD-10-CM | POA: Diagnosis not present

## 2014-04-05 DIAGNOSIS — I872 Venous insufficiency (chronic) (peripheral): Secondary | ICD-10-CM | POA: Diagnosis not present

## 2014-04-05 DIAGNOSIS — L97921 Non-pressure chronic ulcer of unspecified part of left lower leg limited to breakdown of skin: Secondary | ICD-10-CM | POA: Diagnosis not present

## 2014-04-06 DIAGNOSIS — M79605 Pain in left leg: Secondary | ICD-10-CM | POA: Diagnosis not present

## 2014-04-06 DIAGNOSIS — M79604 Pain in right leg: Secondary | ICD-10-CM | POA: Diagnosis not present

## 2014-04-06 DIAGNOSIS — Z515 Encounter for palliative care: Secondary | ICD-10-CM | POA: Diagnosis not present

## 2014-04-08 DIAGNOSIS — Z7409 Other reduced mobility: Secondary | ICD-10-CM | POA: Diagnosis not present

## 2014-04-08 DIAGNOSIS — R531 Weakness: Secondary | ICD-10-CM | POA: Diagnosis not present

## 2014-04-08 DIAGNOSIS — L8990 Pressure ulcer of unspecified site, unspecified stage: Secondary | ICD-10-CM | POA: Diagnosis not present

## 2014-04-09 DIAGNOSIS — E785 Hyperlipidemia, unspecified: Secondary | ICD-10-CM | POA: Diagnosis not present

## 2014-04-09 DIAGNOSIS — F419 Anxiety disorder, unspecified: Secondary | ICD-10-CM | POA: Diagnosis not present

## 2014-04-09 DIAGNOSIS — I5041 Acute combined systolic (congestive) and diastolic (congestive) heart failure: Secondary | ICD-10-CM | POA: Diagnosis not present

## 2014-04-09 DIAGNOSIS — I739 Peripheral vascular disease, unspecified: Secondary | ICD-10-CM | POA: Diagnosis not present

## 2014-04-09 DIAGNOSIS — F329 Major depressive disorder, single episode, unspecified: Secondary | ICD-10-CM | POA: Diagnosis not present

## 2014-04-09 DIAGNOSIS — I2510 Atherosclerotic heart disease of native coronary artery without angina pectoris: Secondary | ICD-10-CM | POA: Diagnosis not present

## 2014-04-09 DIAGNOSIS — I10 Essential (primary) hypertension: Secondary | ICD-10-CM | POA: Diagnosis not present

## 2014-04-09 DIAGNOSIS — Z95810 Presence of automatic (implantable) cardiac defibrillator: Secondary | ICD-10-CM | POA: Diagnosis not present

## 2014-04-09 DIAGNOSIS — I878 Other specified disorders of veins: Secondary | ICD-10-CM | POA: Diagnosis not present

## 2014-04-10 DIAGNOSIS — I10 Essential (primary) hypertension: Secondary | ICD-10-CM | POA: Diagnosis not present

## 2014-04-10 DIAGNOSIS — E785 Hyperlipidemia, unspecified: Secondary | ICD-10-CM | POA: Diagnosis not present

## 2014-04-10 DIAGNOSIS — I2510 Atherosclerotic heart disease of native coronary artery without angina pectoris: Secondary | ICD-10-CM | POA: Diagnosis not present

## 2014-04-10 DIAGNOSIS — I739 Peripheral vascular disease, unspecified: Secondary | ICD-10-CM | POA: Diagnosis not present

## 2014-04-10 DIAGNOSIS — I5041 Acute combined systolic (congestive) and diastolic (congestive) heart failure: Secondary | ICD-10-CM | POA: Diagnosis not present

## 2014-04-10 DIAGNOSIS — Z95810 Presence of automatic (implantable) cardiac defibrillator: Secondary | ICD-10-CM | POA: Diagnosis not present

## 2014-04-11 DIAGNOSIS — I5041 Acute combined systolic (congestive) and diastolic (congestive) heart failure: Secondary | ICD-10-CM | POA: Diagnosis not present

## 2014-04-11 DIAGNOSIS — Z95810 Presence of automatic (implantable) cardiac defibrillator: Secondary | ICD-10-CM | POA: Diagnosis not present

## 2014-04-11 DIAGNOSIS — I10 Essential (primary) hypertension: Secondary | ICD-10-CM | POA: Diagnosis not present

## 2014-04-11 DIAGNOSIS — E785 Hyperlipidemia, unspecified: Secondary | ICD-10-CM | POA: Diagnosis not present

## 2014-04-11 DIAGNOSIS — I2510 Atherosclerotic heart disease of native coronary artery without angina pectoris: Secondary | ICD-10-CM | POA: Diagnosis not present

## 2014-04-11 DIAGNOSIS — I739 Peripheral vascular disease, unspecified: Secondary | ICD-10-CM | POA: Diagnosis not present

## 2014-04-12 DIAGNOSIS — I739 Peripheral vascular disease, unspecified: Secondary | ICD-10-CM | POA: Diagnosis not present

## 2014-04-12 DIAGNOSIS — Z95810 Presence of automatic (implantable) cardiac defibrillator: Secondary | ICD-10-CM | POA: Diagnosis not present

## 2014-04-12 DIAGNOSIS — I2510 Atherosclerotic heart disease of native coronary artery without angina pectoris: Secondary | ICD-10-CM | POA: Diagnosis not present

## 2014-04-12 DIAGNOSIS — E785 Hyperlipidemia, unspecified: Secondary | ICD-10-CM | POA: Diagnosis not present

## 2014-04-12 DIAGNOSIS — I5041 Acute combined systolic (congestive) and diastolic (congestive) heart failure: Secondary | ICD-10-CM | POA: Diagnosis not present

## 2014-04-12 DIAGNOSIS — I10 Essential (primary) hypertension: Secondary | ICD-10-CM | POA: Diagnosis not present

## 2014-04-13 DIAGNOSIS — E785 Hyperlipidemia, unspecified: Secondary | ICD-10-CM | POA: Diagnosis not present

## 2014-04-13 DIAGNOSIS — I2510 Atherosclerotic heart disease of native coronary artery without angina pectoris: Secondary | ICD-10-CM | POA: Diagnosis not present

## 2014-04-13 DIAGNOSIS — Z95810 Presence of automatic (implantable) cardiac defibrillator: Secondary | ICD-10-CM | POA: Diagnosis not present

## 2014-04-13 DIAGNOSIS — I10 Essential (primary) hypertension: Secondary | ICD-10-CM | POA: Diagnosis not present

## 2014-04-13 DIAGNOSIS — I739 Peripheral vascular disease, unspecified: Secondary | ICD-10-CM | POA: Diagnosis not present

## 2014-04-13 DIAGNOSIS — I5041 Acute combined systolic (congestive) and diastolic (congestive) heart failure: Secondary | ICD-10-CM | POA: Diagnosis not present

## 2014-04-14 DIAGNOSIS — Z95810 Presence of automatic (implantable) cardiac defibrillator: Secondary | ICD-10-CM | POA: Diagnosis not present

## 2014-04-14 DIAGNOSIS — I739 Peripheral vascular disease, unspecified: Secondary | ICD-10-CM | POA: Diagnosis not present

## 2014-04-14 DIAGNOSIS — I10 Essential (primary) hypertension: Secondary | ICD-10-CM | POA: Diagnosis not present

## 2014-04-14 DIAGNOSIS — I5041 Acute combined systolic (congestive) and diastolic (congestive) heart failure: Secondary | ICD-10-CM | POA: Diagnosis not present

## 2014-04-14 DIAGNOSIS — I2510 Atherosclerotic heart disease of native coronary artery without angina pectoris: Secondary | ICD-10-CM | POA: Diagnosis not present

## 2014-04-14 DIAGNOSIS — E785 Hyperlipidemia, unspecified: Secondary | ICD-10-CM | POA: Diagnosis not present

## 2014-05-17 DIAGNOSIS — I255 Ischemic cardiomyopathy: Secondary | ICD-10-CM | POA: Diagnosis not present

## 2014-05-17 DIAGNOSIS — Z95810 Presence of automatic (implantable) cardiac defibrillator: Secondary | ICD-10-CM | POA: Diagnosis not present

## 2014-05-22 DIAGNOSIS — L97312 Non-pressure chronic ulcer of right ankle with fat layer exposed: Secondary | ICD-10-CM | POA: Diagnosis not present

## 2014-05-22 DIAGNOSIS — I10 Essential (primary) hypertension: Secondary | ICD-10-CM | POA: Diagnosis not present

## 2014-05-22 DIAGNOSIS — L03119 Cellulitis of unspecified part of limb: Secondary | ICD-10-CM | POA: Diagnosis not present

## 2014-05-22 DIAGNOSIS — L8990 Pressure ulcer of unspecified site, unspecified stage: Secondary | ICD-10-CM | POA: Diagnosis not present

## 2014-05-22 DIAGNOSIS — L97929 Non-pressure chronic ulcer of unspecified part of left lower leg with unspecified severity: Secondary | ICD-10-CM | POA: Diagnosis not present

## 2014-05-22 DIAGNOSIS — L97322 Non-pressure chronic ulcer of left ankle with fat layer exposed: Secondary | ICD-10-CM | POA: Diagnosis not present

## 2014-05-22 DIAGNOSIS — L97919 Non-pressure chronic ulcer of unspecified part of right lower leg with unspecified severity: Secondary | ICD-10-CM | POA: Diagnosis not present

## 2014-05-23 DIAGNOSIS — L97312 Non-pressure chronic ulcer of right ankle with fat layer exposed: Secondary | ICD-10-CM | POA: Diagnosis not present

## 2014-05-23 DIAGNOSIS — E876 Hypokalemia: Secondary | ICD-10-CM | POA: Diagnosis not present

## 2014-05-23 DIAGNOSIS — L97322 Non-pressure chronic ulcer of left ankle with fat layer exposed: Secondary | ICD-10-CM | POA: Diagnosis not present

## 2014-05-23 DIAGNOSIS — L03119 Cellulitis of unspecified part of limb: Secondary | ICD-10-CM | POA: Diagnosis not present

## 2014-05-24 DIAGNOSIS — L97322 Non-pressure chronic ulcer of left ankle with fat layer exposed: Secondary | ICD-10-CM | POA: Diagnosis not present

## 2014-05-24 DIAGNOSIS — E876 Hypokalemia: Secondary | ICD-10-CM | POA: Diagnosis not present

## 2014-05-24 DIAGNOSIS — L03119 Cellulitis of unspecified part of limb: Secondary | ICD-10-CM | POA: Diagnosis not present

## 2014-05-24 DIAGNOSIS — L97929 Non-pressure chronic ulcer of unspecified part of left lower leg with unspecified severity: Secondary | ICD-10-CM | POA: Diagnosis not present

## 2014-05-24 DIAGNOSIS — L97312 Non-pressure chronic ulcer of right ankle with fat layer exposed: Secondary | ICD-10-CM | POA: Diagnosis not present

## 2014-05-24 DIAGNOSIS — L97919 Non-pressure chronic ulcer of unspecified part of right lower leg with unspecified severity: Secondary | ICD-10-CM | POA: Diagnosis not present

## 2014-05-25 DIAGNOSIS — E876 Hypokalemia: Secondary | ICD-10-CM | POA: Diagnosis not present

## 2014-05-25 DIAGNOSIS — E871 Hypo-osmolality and hyponatremia: Secondary | ICD-10-CM | POA: Diagnosis not present

## 2014-05-25 DIAGNOSIS — I255 Ischemic cardiomyopathy: Secondary | ICD-10-CM | POA: Diagnosis not present

## 2014-05-25 DIAGNOSIS — I071 Rheumatic tricuspid insufficiency: Secondary | ICD-10-CM | POA: Diagnosis not present

## 2014-05-25 DIAGNOSIS — I739 Peripheral vascular disease, unspecified: Secondary | ICD-10-CM | POA: Diagnosis not present

## 2014-05-25 DIAGNOSIS — L97929 Non-pressure chronic ulcer of unspecified part of left lower leg with unspecified severity: Secondary | ICD-10-CM | POA: Diagnosis not present

## 2014-05-25 DIAGNOSIS — L97322 Non-pressure chronic ulcer of left ankle with fat layer exposed: Secondary | ICD-10-CM | POA: Diagnosis not present

## 2014-05-25 DIAGNOSIS — L97919 Non-pressure chronic ulcer of unspecified part of right lower leg with unspecified severity: Secondary | ICD-10-CM | POA: Diagnosis not present

## 2014-05-25 DIAGNOSIS — L03119 Cellulitis of unspecified part of limb: Secondary | ICD-10-CM | POA: Diagnosis not present

## 2014-05-25 DIAGNOSIS — L97312 Non-pressure chronic ulcer of right ankle with fat layer exposed: Secondary | ICD-10-CM | POA: Diagnosis not present

## 2014-05-25 DIAGNOSIS — I340 Nonrheumatic mitral (valve) insufficiency: Secondary | ICD-10-CM | POA: Diagnosis not present

## 2014-05-26 DIAGNOSIS — L03119 Cellulitis of unspecified part of limb: Secondary | ICD-10-CM | POA: Diagnosis not present

## 2014-05-26 DIAGNOSIS — E876 Hypokalemia: Secondary | ICD-10-CM | POA: Diagnosis not present

## 2014-05-26 DIAGNOSIS — L97322 Non-pressure chronic ulcer of left ankle with fat layer exposed: Secondary | ICD-10-CM | POA: Diagnosis not present

## 2014-05-26 DIAGNOSIS — L97312 Non-pressure chronic ulcer of right ankle with fat layer exposed: Secondary | ICD-10-CM | POA: Diagnosis not present

## 2014-05-29 ENCOUNTER — Telehealth: Payer: Self-pay | Admitting: *Deleted

## 2014-05-29 NOTE — Telephone Encounter (Signed)
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 His problem with this is he needs refills of his medications that they put him on and one med being morphine. He no longer drives and would only be able to come in and see you on Thursday morning due to having doctor appts and transportation issues. He said that his vascular surgeon is Dr. Ardyth HarpsShawn Fleming @ [facility] and he has an office in [place] his # [phone] if we have any questions. Sean AguasHoward said that his legs are scheduled for amputation after the first of the year. He runs out of his current medications Christmas Eve. Please advise. Sean SkainsJamie Kamali Nephew, CMA

## 2014-05-29 NOTE — Telephone Encounter (Signed)
I would just like him to be reminded that I do not prescribe anything stronger than the oxycodone 5mg  tablets that I had prescribed him earlier.  I'm willing to continue to provide him with this however Morphine is far out of my speciality.

## 2014-05-30 MED ORDER — POTASSIUM CHLORIDE ER 10 MEQ PO CPCR: ORAL_CAPSULE | ORAL | Status: AC

## 2014-05-30 MED ORDER — OXYCODONE HCL 5 MG PO TABS: 5.0000 mg | ORAL_TABLET | Freq: Three times a day (TID) | ORAL | Status: AC | PRN

## 2014-05-30 MED ORDER — ISOSORBIDE MONONITRATE ER 30 MG PO TB24: 30.0000 mg | ORAL_TABLET | Freq: Every day | ORAL | Status: AC

## 2014-05-30 NOTE — Telephone Encounter (Signed)
I spoke with [name] this morning and he is going to contact his vascular surgeon regarding the morphine. He needs a refill for the oxycodone, isosorbide and potassium which I will put in your box for signature. Corliss SkainsJamie Farouk Vivero, CMA

## 2014-06-11 DIAGNOSIS — L97322 Non-pressure chronic ulcer of left ankle with fat layer exposed: Secondary | ICD-10-CM | POA: Diagnosis not present

## 2014-06-11 DIAGNOSIS — I739 Peripheral vascular disease, unspecified: Secondary | ICD-10-CM | POA: Diagnosis not present

## 2014-06-11 DIAGNOSIS — I255 Ischemic cardiomyopathy: Secondary | ICD-10-CM | POA: Diagnosis not present

## 2014-06-11 DIAGNOSIS — L97312 Non-pressure chronic ulcer of right ankle with fat layer exposed: Secondary | ICD-10-CM | POA: Diagnosis not present

## 2014-06-14 DIAGNOSIS — I739 Peripheral vascular disease, unspecified: Secondary | ICD-10-CM | POA: Diagnosis not present

## 2014-06-14 DIAGNOSIS — Z4502 Encounter for adjustment and management of automatic implantable cardiac defibrillator: Secondary | ICD-10-CM | POA: Diagnosis not present

## 2014-06-14 DIAGNOSIS — I5041 Acute combined systolic (congestive) and diastolic (congestive) heart failure: Secondary | ICD-10-CM | POA: Diagnosis not present

## 2014-06-14 DIAGNOSIS — I255 Ischemic cardiomyopathy: Secondary | ICD-10-CM | POA: Diagnosis not present

## 2014-06-14 DIAGNOSIS — R52 Pain, unspecified: Secondary | ICD-10-CM | POA: Diagnosis not present

## 2014-06-14 DIAGNOSIS — I2510 Atherosclerotic heart disease of native coronary artery without angina pectoris: Secondary | ICD-10-CM | POA: Diagnosis not present

## 2014-07-25 ENCOUNTER — Telehealth: Payer: Self-pay | Admitting: *Deleted

## 2014-07-25 NOTE — Telephone Encounter (Signed)
Yes i agree thank you

## 2014-07-25 NOTE — Telephone Encounter (Signed)
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 Called her back at [phone] and left a message that it was ok

## 2014-08-03 ENCOUNTER — Ambulatory Visit: Payer: Medicare Other | Admitting: Family Medicine

## 2014-08-20 ENCOUNTER — Telehealth: Payer: Self-pay

## 2014-08-20 NOTE — Telephone Encounter (Signed)
Sean Benson with [facility] would like to continue wound care once weekly for a few more weeks. Gave verbal order to continue care.    Asher MuirJamie - [facility] [phone]

## 2014-08-20 NOTE — Telephone Encounter (Signed)
Agree with verbal order 

## 2015-12-08 IMAGING — CR DG ANKLE COMPLETE 3+V*L*
3 series · 3 of 3 positions shown · non-contrast
Comparison: No prior.

CLINICAL DATA: Pain.

EXAM:
LEFT ANKLE COMPLETE - 3+ VIEW

[view not recorded (1 of 3)]
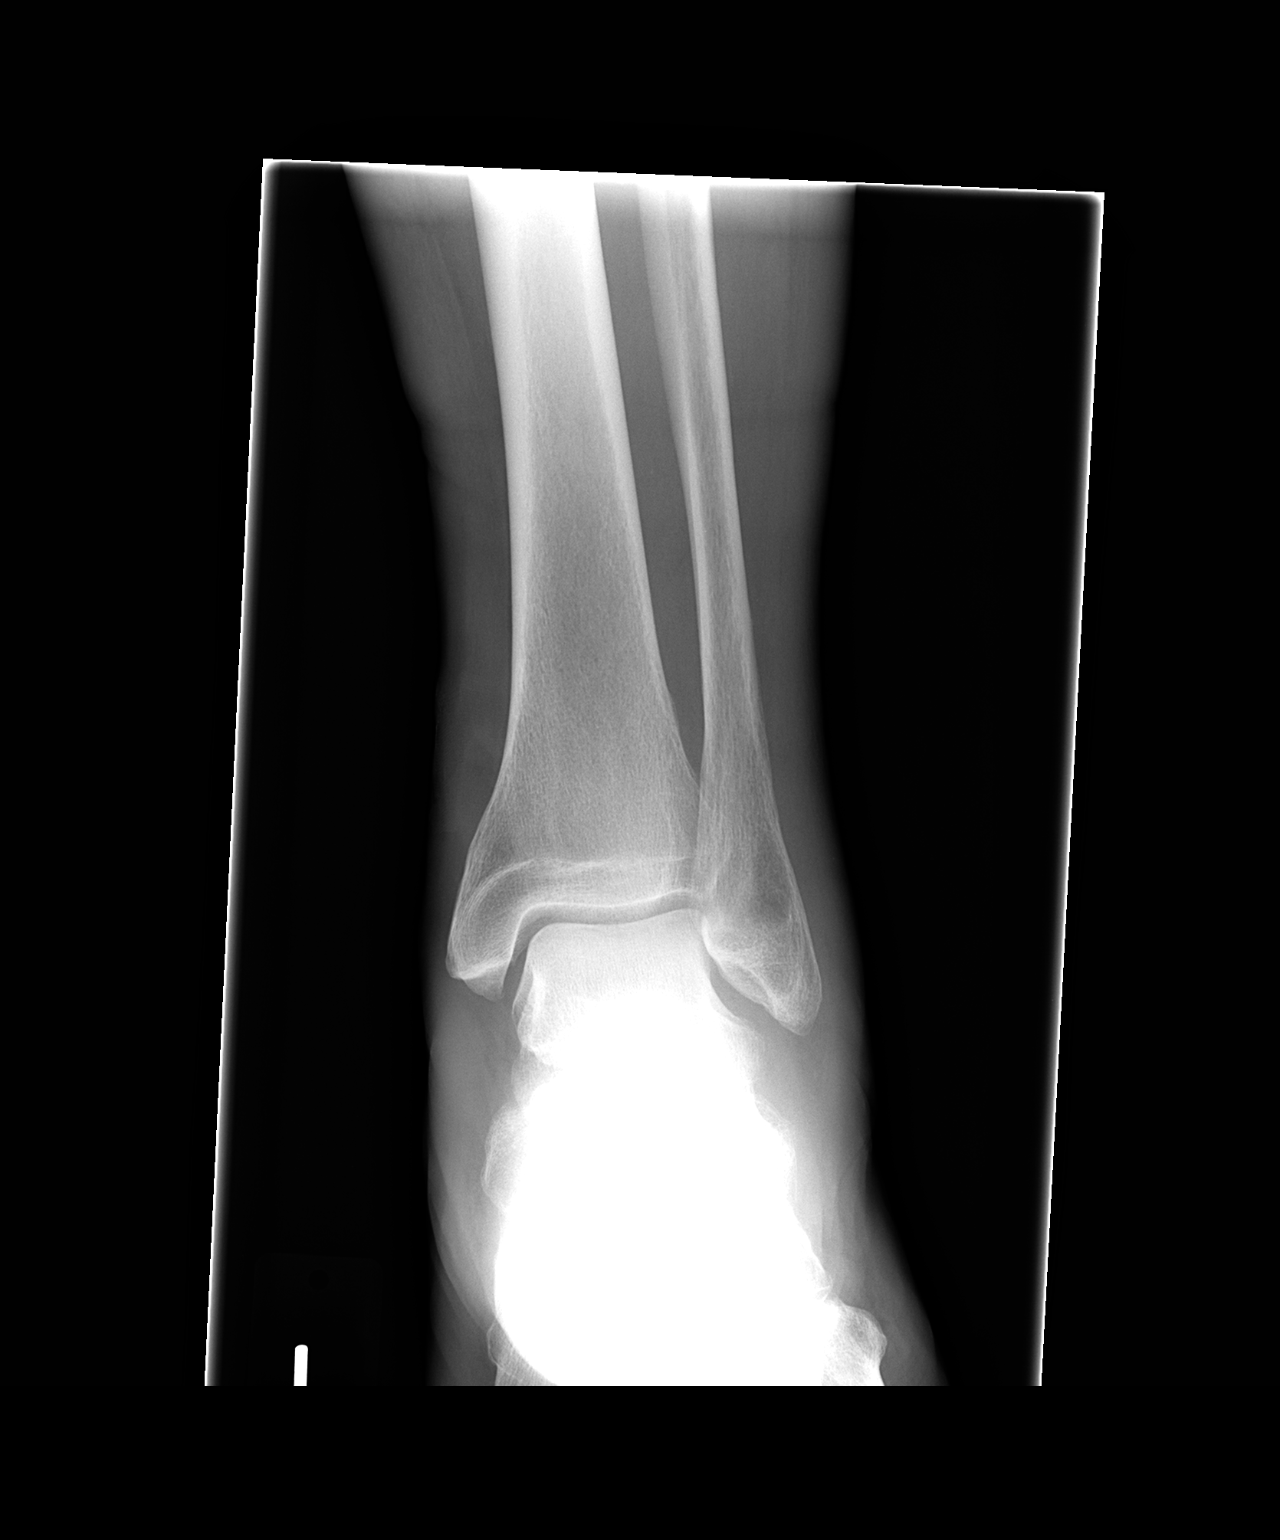

[view not recorded (2 of 3)]
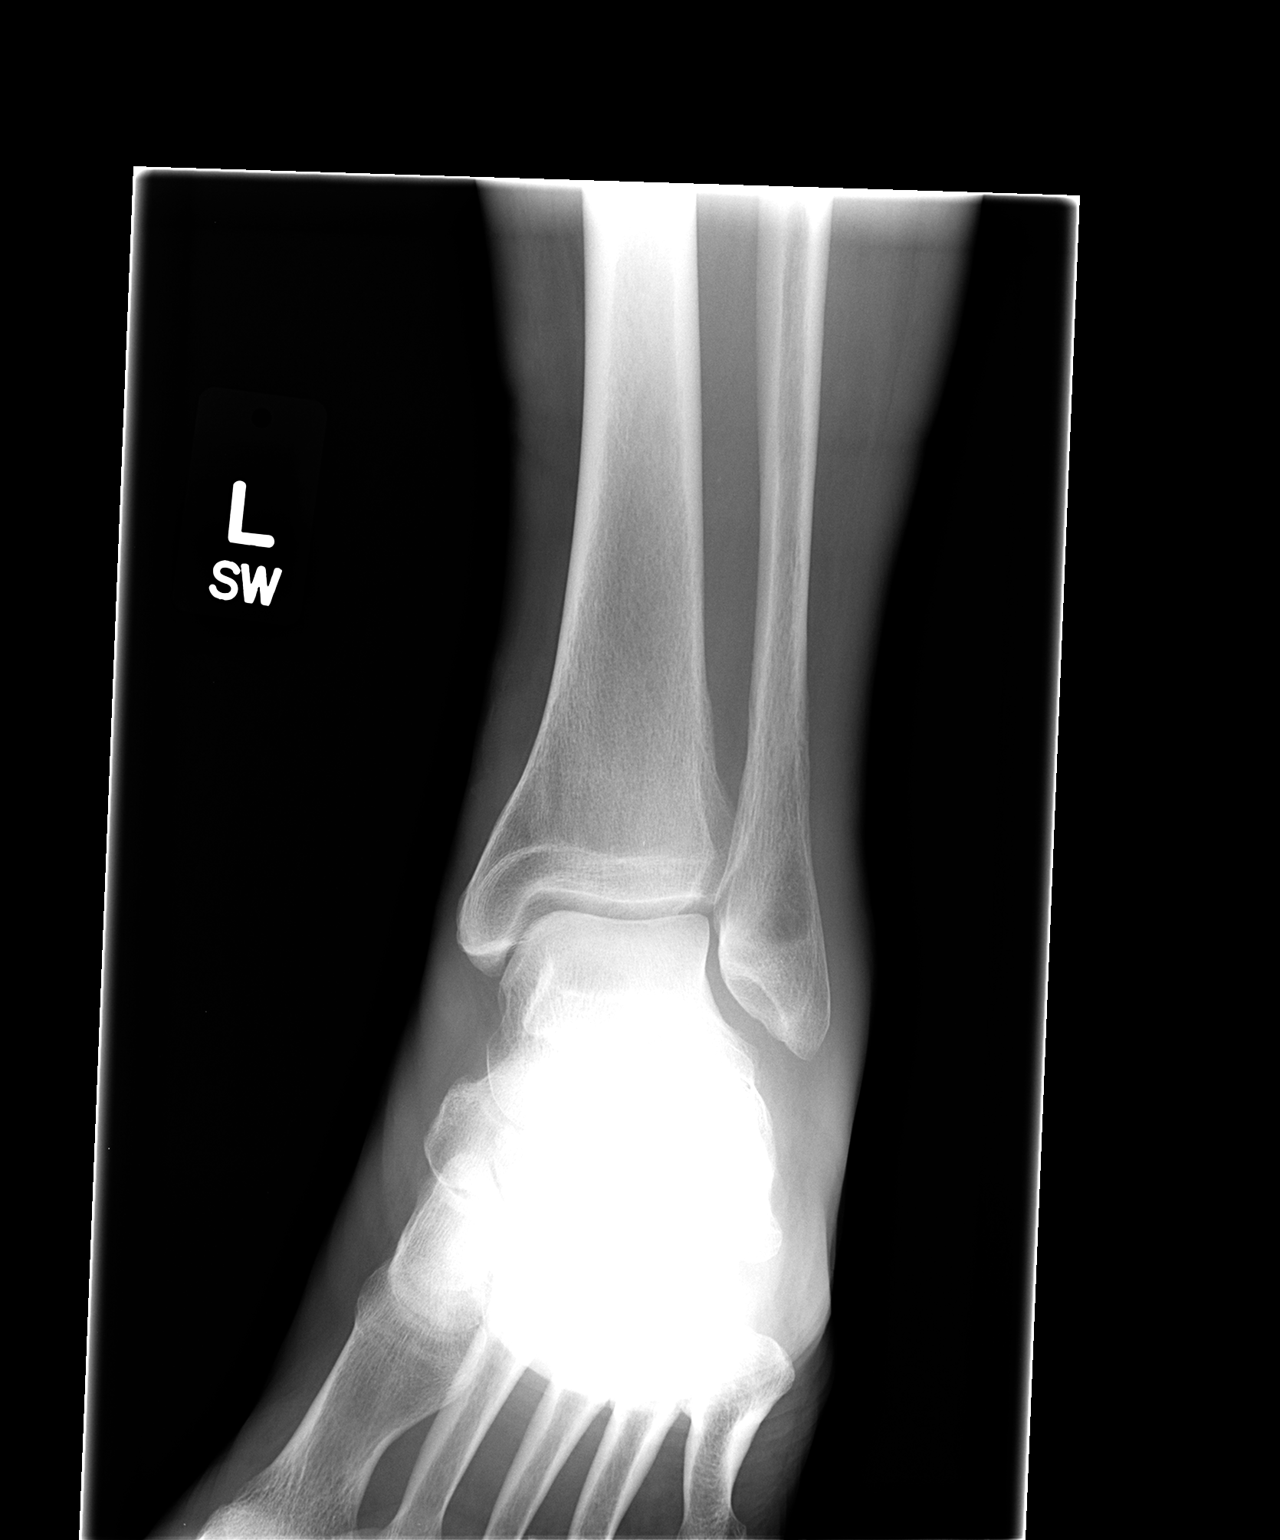

[view not recorded (3 of 3)]
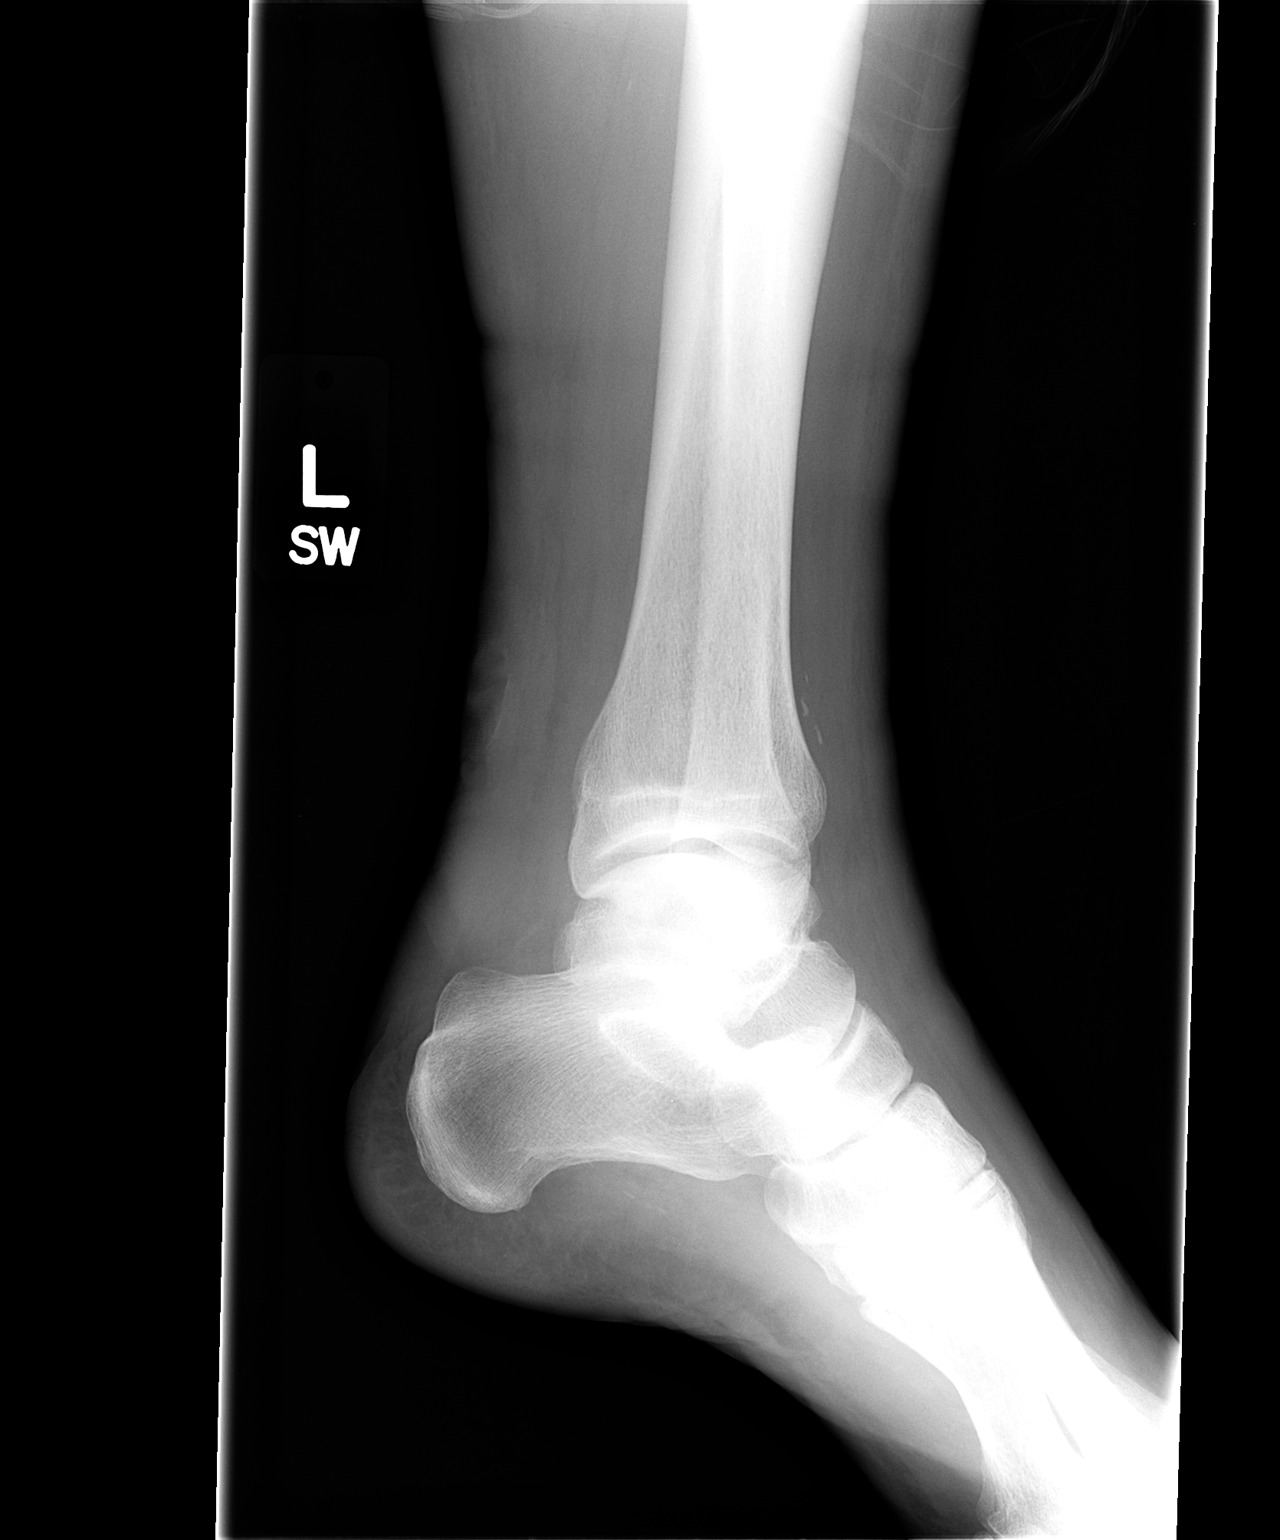

[3 of 3 positions shown; findings below may reference images not displayed]

FINDINGS: Diffuse soft tissue swelling. No evidence of fracture or
dislocation. Benign soft tissue calcifications. No foreign body.
IMPRESSION: Diffuse soft tissue swelling.  No acute abnormality.

## 2017-07-16 DEATH — deceased
